# Patient Record
Sex: Male | Born: 1965 | Race: White | Hispanic: No | Marital: Single | Smoking: Never smoker
Health system: Southern US, Community
[De-identification: ages and names within clinical notes are randomized; demographics above are authoritative.]

## PROBLEM LIST (undated history)

## (undated) DIAGNOSIS — F82 Specific developmental disorder of motor function: Secondary | ICD-10-CM

## (undated) DIAGNOSIS — F840 Autistic disorder: Secondary | ICD-10-CM

## (undated) HISTORY — PX: CYST REMOVAL NECK: SHX6281

## (undated) HISTORY — DX: Autistic disorder: F84.0

## (undated) HISTORY — DX: Specific developmental disorder of motor function: F82

---

## 2020-05-30 ENCOUNTER — Other Ambulatory Visit: Payer: Self-pay

## 2020-05-30 ENCOUNTER — Inpatient Hospital Stay
Admission: EM | Admit: 2020-05-30 | Discharge: 2020-06-19 | DRG: 177 | Disposition: A | Discharge status: Home Health | Payer: Managed Care, Other (non HMO) | Attending: Internal Medicine | Admitting: Internal Medicine

## 2020-05-30 ENCOUNTER — Emergency Department: Payer: Managed Care, Other (non HMO)

## 2020-05-30 ENCOUNTER — Encounter: Payer: Self-pay | Admitting: Emergency Medicine

## 2020-05-30 DIAGNOSIS — E872 Acidosis, unspecified: Secondary | ICD-10-CM

## 2020-05-30 DIAGNOSIS — R197 Diarrhea, unspecified: Secondary | ICD-10-CM

## 2020-05-30 DIAGNOSIS — L03116 Cellulitis of left lower limb: Secondary | ICD-10-CM | POA: Diagnosis present

## 2020-05-30 DIAGNOSIS — R0602 Shortness of breath: Secondary | ICD-10-CM

## 2020-05-30 DIAGNOSIS — U071 COVID-19: Principal | ICD-10-CM | POA: Diagnosis present

## 2020-05-30 DIAGNOSIS — J1282 Pneumonia due to coronavirus disease 2019: Secondary | ICD-10-CM | POA: Diagnosis present

## 2020-05-30 DIAGNOSIS — R6250 Unspecified lack of expected normal physiological development in childhood: Secondary | ICD-10-CM | POA: Diagnosis present

## 2020-05-30 DIAGNOSIS — Z6841 Body Mass Index (BMI) 40.0 and over, adult: Secondary | ICD-10-CM

## 2020-05-30 DIAGNOSIS — E861 Hypovolemia: Secondary | ICD-10-CM | POA: Diagnosis present

## 2020-05-30 DIAGNOSIS — E871 Hypo-osmolality and hyponatremia: Secondary | ICD-10-CM | POA: Diagnosis present

## 2020-05-30 DIAGNOSIS — J9601 Acute respiratory failure with hypoxia: Secondary | ICD-10-CM | POA: Diagnosis present

## 2020-05-30 DIAGNOSIS — N179 Acute kidney failure, unspecified: Secondary | ICD-10-CM

## 2020-05-30 LAB — CBC WITH DIFFERENTIAL/PLATELET
Abs Immature Granulocytes: 0.02 10*3/uL (ref 0.00–0.07)
Basophils Absolute: 0 10*3/uL (ref 0.0–0.1)
Basophils Relative: 0 %
Eosinophils Absolute: 0 10*3/uL (ref 0.0–0.5)
Eosinophils Relative: 0 %
HCT: 42.1 % (ref 39.0–52.0)
Hemoglobin: 15.4 g/dL (ref 13.0–17.0)
Immature Granulocytes: 0 %
Lymphocytes Relative: 12 %
Lymphs Abs: 0.7 10*3/uL (ref 0.7–4.0)
MCH: 30.5 pg (ref 26.0–34.0)
MCHC: 36.6 g/dL — ABNORMAL HIGH (ref 30.0–36.0)
MCV: 83.4 fL (ref 80.0–100.0)
Monocytes Absolute: 0.3 10*3/uL (ref 0.1–1.0)
Monocytes Relative: 4 %
Neutro Abs: 5.2 10*3/uL (ref 1.7–7.7)
Neutrophils Relative %: 84 %
Platelets: 146 10*3/uL — ABNORMAL LOW (ref 150–400)
RBC: 5.05 MIL/uL (ref 4.22–5.81)
RDW: 13.5 % (ref 11.5–15.5)
WBC: 6.2 10*3/uL (ref 4.0–10.5)
nRBC: 0 % (ref 0.0–0.2)

## 2020-05-30 LAB — TROPONIN I (HIGH SENSITIVITY): Troponin I (High Sensitivity): 15 ng/L (ref <18)

## 2020-05-30 LAB — COMPREHENSIVE METABOLIC PANEL
ALT: 78 U/L — ABNORMAL HIGH (ref 0–44)
AST: 98 U/L — ABNORMAL HIGH (ref 15–41)
Albumin: 3.9 g/dL (ref 3.5–5.0)
Alkaline Phosphatase: 80 U/L (ref 38–126)
Anion gap: 15 (ref 5–15)
BUN: 19 mg/dL (ref 6–20)
CO2: 17 mmol/L — ABNORMAL LOW (ref 22–32)
Calcium: 8.2 mg/dL — ABNORMAL LOW (ref 8.9–10.3)
Chloride: 97 mmol/L — ABNORMAL LOW (ref 98–111)
Creatinine, Ser: 1.26 mg/dL — ABNORMAL HIGH (ref 0.61–1.24)
GFR calc Af Amer: >60 mL/min (ref >60)
GFR calc non Af Amer: >60 mL/min (ref >60)
Glucose, Bld: 129 mg/dL — ABNORMAL HIGH (ref 70–99)
Potassium: 3.2 mmol/L — ABNORMAL LOW (ref 3.5–5.1)
Sodium: 129 mmol/L — ABNORMAL LOW (ref 135–145)
Total Bilirubin: 0.9 mg/dL (ref 0.3–1.2)
Total Protein: 7.6 g/dL (ref 6.5–8.1)

## 2020-05-30 LAB — SARS CORONAVIRUS 2 BY RT PCR (HOSPITAL ORDER, PERFORMED IN ~~LOC~~ HOSPITAL LAB): SARS Coronavirus 2: POSITIVE — AB

## 2020-05-30 MED ORDER — LACTATED RINGERS IV BOLUS
1000.0000 mL | Freq: Once | INTRAVENOUS | Status: AC
  Administered 2020-05-30: 1000 mL via INTRAVENOUS

## 2020-05-30 MED ORDER — ACETAMINOPHEN 325 MG PO TABS
650.0000 mg | ORAL_TABLET | Freq: Once | ORAL | Status: AC | PRN
  Administered 2020-05-30: 650 mg via ORAL
  Filled 2020-05-30: qty 2

## 2020-05-30 NOTE — ED Triage Notes (Signed)
Pt presents to ED with fever, sob, weakness, frequent diarrhea, and non-productive cough for the past week. Pt reports his shortness of breath worsened today. Pt has increased work of breathing noted.

## 2020-05-30 NOTE — ED Provider Notes (Signed)
[facility] Emergency Department Provider Note   ____________________________________________   First MD Initiated Contact with Patient 05/30/20 2310     (approximate)  I have reviewed the triage vital signs and the nursing notes.   HISTORY  Chief Complaint Fever and Shortness of Breath    HPI Dwayne Dalton is a 54 y.o. male with no significant past medical history who presents to the ED complaining of fever and cough.  Patient reports that he has been feeling bad for about the past week with subjective fevers, chills, cough, chest pain, and shortness of breath.  He also endorses diarrhea and feels like he has been getting dehydrated.  He describes the pain in his chest as sharp and exacerbated when he goes to take a deep breath.  He thought his symptoms might be due to working outside in the heat and he denies any recent sick contacts.  He has not been vaccinated against COVID-19.  He denies any dysuria or hematuria.        History reviewed. No pertinent past medical history.  There are no problems to display for this patient.   History reviewed. No pertinent surgical history.  Prior to Admission medications   Not on File    Allergies Patient has no known allergies.  No family history on file.  Social History Social History   Tobacco Use   Smoking status: Never Smoker   Smokeless tobacco: Never Used  Building services engineer Use: Never used  Substance Use Topics   Alcohol use: Never   Drug use: Never    Review of Systems  Constitutional: Positive for fever/chills Eyes: No visual changes. ENT: No sore throat. Cardiovascular: Positive for chest pain. Respiratory: Positive for cough and shortness of breath. Gastrointestinal: No abdominal pain.  No nausea, no vomiting.  Positive for diarrhea.  No constipation. Genitourinary: Negative for dysuria. Musculoskeletal: Negative for back pain. Skin: Negative for rash. Neurological:  Negative for headaches, focal weakness or numbness.  ____________________________________________   PHYSICAL EXAM:  VITAL SIGNS: ED Triage Vitals  Enc Vitals Group     BP 05/30/20 2048 (!) 150/90     Pulse Rate 05/30/20 2048 (!) 110     Resp 05/30/20 2048 (!) 28     Temp 05/30/20 2048 (!) 102.7 F (39.3 C)     Temp Source 05/30/20 2048 Oral     SpO2 05/30/20 2048 93 %     Weight 05/30/20 2052 275 lb (124.7 kg)     Height 05/30/20 2052 5\' 7"  (1.702 m)     Head Circumference --      Peak Flow --      Pain Score 05/30/20 2052 0     Pain Loc --      Pain Edu? --      Excl. in GC? --     Constitutional: Alert and oriented. Eyes: Conjunctivae are normal. Head: Atraumatic. Nose: No congestion/rhinnorhea. Mouth/Throat: Mucous membranes are moist. Neck: Normal ROM Cardiovascular: Tachycardic, regular rhythm. Grossly normal heart sounds. Respiratory: Tachypneic with increased respiratory effort.  No retractions. Lungs CTAB. Gastrointestinal: Soft and nontender. No distention. Genitourinary: deferred Musculoskeletal: No lower extremity tenderness nor edema. Neurologic:  Normal speech and language. No gross focal neurologic deficits are appreciated. Skin:  Skin is warm, dry and intact. No rash noted. Psychiatric: Mood and affect are normal. Speech and behavior are normal.  ____________________________________________   LABS (all labs ordered are listed, but only abnormal results are displayed)  Labs Reviewed  SARS CORONAVIRUS 2 BY RT PCR (HOSPITAL ORDER, PERFORMED IN [facility]) - Abnormal; Notable for the following components:      Result Value   SARS Coronavirus 2 POSITIVE (*)    All other components within normal limits  CBC WITH DIFFERENTIAL/PLATELET - Abnormal; Notable for the following components:   MCHC 36.6 (*)    Platelets 146 (*)    All other components within normal limits  COMPREHENSIVE METABOLIC PANEL - Abnormal; Notable for the following  components:   Sodium 129 (*)    Potassium 3.2 (*)    Chloride 97 (*)    CO2 17 (*)    Glucose, Bld 129 (*)    Creatinine, Ser 1.26 (*)    Calcium 8.2 (*)    AST 98 (*)    ALT 78 (*)    All other components within normal limits  TROPONIN I (HIGH SENSITIVITY) - Abnormal; Notable for the following components:   Troponin I (High Sensitivity) 18 (*)    All other components within normal limits  CULTURE, BLOOD (ROUTINE X 2)  CULTURE, BLOOD (ROUTINE X 2)  LACTIC ACID, PLASMA  PROCALCITONIN  LACTIC ACID, PLASMA  C-REACTIVE PROTEIN  FIBRIN DERIVATIVES D-DIMER ([facility] ONLY)  FERRITIN  TROPONIN I (HIGH SENSITIVITY)   ____________________________________________  EKG  ED ECG REPORT I, Chesley Noon, the attending physician, personally viewed and interpreted this ECG.   Date: 05/30/2020  EKG Time: 21:12  Rate: 105  Rhythm: sinus tachycardia  Axis: Normal  Intervals:none  ST&T Change: None   PROCEDURES  Procedure(s) performed (including Critical Care):  Procedures   ____________________________________________   INITIAL IMPRESSION / ASSESSMENT AND PLAN / ED COURSE       54 year old male with no significant past medical history presents to the ED with 1 week of subjective fevers, chills, cough, chest pain, and shortness of breath.  He denies any sick contacts but has not yet been vaccinated for COVID-19.  COVID-19 testing was performed from triage and is positive, chest x-ray also appears consistent with a viral pneumonia.  He was febrile and tachycardic as well as tachypneic on presentation, but is well-appearing on my assessment and I have low suspicion for bacterial sepsis.  We will screen blood cultures and lactate as well as procalcitonin, no leukocytosis noted.  He is tachypneic but maintaining O2 sats on room air, we will have him ambulate on a pulse ox.  Labs also remarkable for mild hyponatremia as well as possible AKI, but patient has no prior creatinine for  comparison.  Given his diarrhea, he is likely dehydrated and we will hydrate with IV fluids.  While patient is maintaining his O2 sats, he continues to have significant tachypnea with respiratory rate of 28 on reassessment.  Given this as well as mild transaminitis and hyponatremia, case discussed with hospitalist for admission.      ____________________________________________   FINAL CLINICAL IMPRESSION(S) / ED DIAGNOSES  Final diagnoses:  Pneumonia due to COVID-19 virus  Shortness of breath     ED Discharge Orders    None       Note:  This document was prepared using Dragon voice recognition software and may include unintentional dictation errors.   Chesley Noon, MD 05/31/20 [phone]

## 2020-05-30 NOTE — ED Notes (Signed)
Pt reports feeling dehydrated, diarrhea and cough for 1 week.  Pt works in Warden/ranger at [employer].   Pt has sob.  No chest pain.  Sinus tach on monitor.  Iv started.

## 2020-05-31 ENCOUNTER — Other Ambulatory Visit: Payer: Self-pay

## 2020-05-31 ENCOUNTER — Encounter: Payer: Self-pay | Admitting: Internal Medicine

## 2020-05-31 DIAGNOSIS — E872 Acidosis, unspecified: Secondary | ICD-10-CM

## 2020-05-31 DIAGNOSIS — R0602 Shortness of breath: Secondary | ICD-10-CM | POA: Diagnosis present

## 2020-05-31 DIAGNOSIS — E861 Hypovolemia: Secondary | ICD-10-CM | POA: Diagnosis present

## 2020-05-31 DIAGNOSIS — U071 COVID-19: Secondary | ICD-10-CM | POA: Diagnosis present

## 2020-05-31 DIAGNOSIS — A0839 Other viral enteritis: Secondary | ICD-10-CM

## 2020-05-31 DIAGNOSIS — Z6841 Body Mass Index (BMI) 40.0 and over, adult: Secondary | ICD-10-CM | POA: Diagnosis not present

## 2020-05-31 DIAGNOSIS — R6250 Unspecified lack of expected normal physiological development in childhood: Secondary | ICD-10-CM | POA: Diagnosis present

## 2020-05-31 DIAGNOSIS — E871 Hypo-osmolality and hyponatremia: Secondary | ICD-10-CM | POA: Diagnosis present

## 2020-05-31 DIAGNOSIS — E66813 Obesity, class 3: Secondary | ICD-10-CM

## 2020-05-31 DIAGNOSIS — L03116 Cellulitis of left lower limb: Secondary | ICD-10-CM | POA: Diagnosis present

## 2020-05-31 DIAGNOSIS — J9601 Acute respiratory failure with hypoxia: Secondary | ICD-10-CM | POA: Diagnosis present

## 2020-05-31 DIAGNOSIS — J1282 Pneumonia due to coronavirus disease 2019: Secondary | ICD-10-CM

## 2020-05-31 DIAGNOSIS — N179 Acute kidney failure, unspecified: Secondary | ICD-10-CM | POA: Diagnosis present

## 2020-05-31 DIAGNOSIS — J9600 Acute respiratory failure, unspecified whether with hypoxia or hypercapnia: Secondary | ICD-10-CM | POA: Diagnosis not present

## 2020-05-31 HISTORY — DX: Obesity, class 3: E66.813

## 2020-05-31 HISTORY — DX: Morbid (severe) obesity due to excess calories: E66.01

## 2020-05-31 LAB — CBC
HCT: 38.4 % — ABNORMAL LOW (ref 39.0–52.0)
Hemoglobin: 13.9 g/dL (ref 13.0–17.0)
MCH: 30.6 pg (ref 26.0–34.0)
MCHC: 36.2 g/dL — ABNORMAL HIGH (ref 30.0–36.0)
MCV: 84.6 fL (ref 80.0–100.0)
Platelets: 129 10*3/uL — ABNORMAL LOW (ref 150–400)
RBC: 4.54 MIL/uL (ref 4.22–5.81)
RDW: 13.4 % (ref 11.5–15.5)
WBC: 5.7 10*3/uL (ref 4.0–10.5)
nRBC: 0 % (ref 0.0–0.2)

## 2020-05-31 LAB — TROPONIN I (HIGH SENSITIVITY): Troponin I (High Sensitivity): 18 ng/L — ABNORMAL HIGH (ref <18)

## 2020-05-31 LAB — PROCALCITONIN: Procalcitonin: 0.27 ng/mL

## 2020-05-31 LAB — LACTIC ACID, PLASMA
Lactic Acid, Venous: 1.5 mmol/L (ref 0.5–1.9)
Lactic Acid, Venous: 1.8 mmol/L (ref 0.5–1.9)

## 2020-05-31 LAB — ABO/RH: ABO/RH(D): O NEG

## 2020-05-31 LAB — FERRITIN: Ferritin: 1370 ng/mL — ABNORMAL HIGH (ref 24–336)

## 2020-05-31 LAB — FIBRIN DERIVATIVES D-DIMER (ARMC ONLY): Fibrin derivatives D-dimer (ARMC): 1312.84 ng/mL (FEU) — ABNORMAL HIGH (ref 0.00–499.00)

## 2020-05-31 LAB — CREATININE, SERUM
Creatinine, Ser: 1.68 mg/dL — ABNORMAL HIGH (ref 0.61–1.24)
GFR calc Af Amer: 53 mL/min — ABNORMAL LOW (ref >60)
GFR calc non Af Amer: 46 mL/min — ABNORMAL LOW (ref >60)

## 2020-05-31 LAB — HIV ANTIBODY (ROUTINE TESTING W REFLEX): HIV Screen 4th Generation wRfx: NONREACTIVE

## 2020-05-31 LAB — C-REACTIVE PROTEIN: CRP: 12.2 mg/dL — ABNORMAL HIGH (ref <1.0)

## 2020-05-31 MED ORDER — ACETAMINOPHEN 325 MG PO TABS
650.0000 mg | ORAL_TABLET | Freq: Four times a day (QID) | ORAL | Status: DC | PRN
  Administered 2020-05-31 – 2020-06-07 (×4): 650 mg via ORAL
  Filled 2020-05-31 (×4): qty 2

## 2020-05-31 MED ORDER — LACTATED RINGERS IV BOLUS
1000.0000 mL | Freq: Once | INTRAVENOUS | Status: AC
  Administered 2020-05-31: 1000 mL via INTRAVENOUS

## 2020-05-31 MED ORDER — ZINC SULFATE 220 (50 ZN) MG PO CAPS
220.0000 mg | ORAL_CAPSULE | Freq: Every day | ORAL | Status: DC
  Administered 2020-05-31 – 2020-06-19 (×20): 220 mg via ORAL
  Filled 2020-05-31 (×20): qty 1

## 2020-05-31 MED ORDER — ENOXAPARIN SODIUM 60 MG/0.6ML ~~LOC~~ SOLN
0.5000 mg/kg | SUBCUTANEOUS | Status: DC
  Administered 2020-06-01 – 2020-06-02 (×2): 62.5 mg via SUBCUTANEOUS
  Filled 2020-05-31 (×3): qty 1.2

## 2020-05-31 MED ORDER — LACTATED RINGERS IV SOLN: INTRAVENOUS | Status: DC

## 2020-05-31 MED ORDER — ENOXAPARIN SODIUM 40 MG/0.4ML ~~LOC~~ SOLN
40.0000 mg | Freq: Two times a day (BID) | SUBCUTANEOUS | Status: DC
  Administered 2020-05-31: 40 mg via SUBCUTANEOUS
  Filled 2020-05-31: qty 0.4

## 2020-05-31 MED ORDER — DEXAMETHASONE SODIUM PHOSPHATE 10 MG/ML IJ SOLN
10.0000 mg | Freq: Once | INTRAMUSCULAR | Status: AC
  Administered 2020-05-31: 10 mg via INTRAVENOUS
  Filled 2020-05-31: qty 1

## 2020-05-31 MED ORDER — SODIUM CHLORIDE 0.9 % IV SOLN
200.0000 mg | Freq: Once | INTRAVENOUS | Status: AC
  Administered 2020-05-31: 200 mg via INTRAVENOUS
  Filled 2020-05-31: qty 200

## 2020-05-31 MED ORDER — DEXAMETHASONE SODIUM PHOSPHATE 10 MG/ML IJ SOLN
6.0000 mg | INTRAMUSCULAR | Status: DC
  Administered 2020-06-01 – 2020-06-03 (×4): 6 mg via INTRAVENOUS
  Filled 2020-05-31 (×4): qty 1

## 2020-05-31 MED ORDER — SODIUM CHLORIDE 0.9 % IV SOLN
100.0000 mg | Freq: Every day | INTRAVENOUS | Status: AC
  Administered 2020-06-01 – 2020-06-04 (×4): 100 mg via INTRAVENOUS
  Filled 2020-05-31 (×4): qty 20

## 2020-05-31 MED ORDER — ONDANSETRON HCL 4 MG/2ML IJ SOLN
4.0000 mg | Freq: Four times a day (QID) | INTRAMUSCULAR | Status: DC | PRN
  Administered 2020-06-04: 4 mg via INTRAVENOUS
  Filled 2020-05-31: qty 2

## 2020-05-31 MED ORDER — ALBUTEROL SULFATE HFA 108 (90 BASE) MCG/ACT IN AERS
2.0000 | INHALATION_SPRAY | Freq: Four times a day (QID) | RESPIRATORY_TRACT | Status: DC
  Administered 2020-05-31 – 2020-06-07 (×30): 2 via RESPIRATORY_TRACT
  Filled 2020-05-31: qty 6.7

## 2020-05-31 MED ORDER — ASCORBIC ACID 500 MG PO TABS
500.0000 mg | ORAL_TABLET | Freq: Every day | ORAL | Status: DC
  Administered 2020-05-31 – 2020-06-19 (×20): 500 mg via ORAL
  Filled 2020-05-31 (×20): qty 1

## 2020-05-31 MED ORDER — GUAIFENESIN-DM 100-10 MG/5ML PO SYRP
10.0000 mL | ORAL_SOLUTION | ORAL | Status: DC | PRN
  Administered 2020-05-31 – 2020-06-06 (×7): 10 mL via ORAL
  Filled 2020-05-31 (×8): qty 10

## 2020-05-31 MED ORDER — HYDROCOD POLST-CPM POLST ER 10-8 MG/5ML PO SUER
5.0000 mL | Freq: Two times a day (BID) | ORAL | Status: DC | PRN
  Administered 2020-05-31 – 2020-06-15 (×16): 5 mL via ORAL
  Filled 2020-05-31 (×16): qty 5

## 2020-05-31 MED ORDER — ONDANSETRON HCL 4 MG PO TABS: 4.0000 mg | ORAL_TABLET | Freq: Four times a day (QID) | ORAL | Status: DC | PRN

## 2020-05-31 NOTE — Plan of Care (Signed)
  Problem: Education: Goal: Knowledge of risk factors and measures for prevention of condition will improve Outcome: Progressing   

## 2020-05-31 NOTE — H&P (Signed)
History and Physical    Dwayne Dalton VWU:[id number] DOB: 21-Jul-1966 DOA: 05/30/2020  PCP: Patient, No Pcp Per   Patient coming from: home  I have personally briefly reviewed patient's old medical records in [facility]  Chief Complaint: Shortness of breath  HPI: Dwayne Dalton is a 54 y.o. male with no significant medical history who presents with a 1 week history of fever chills cough and shortness of breath as well as chest pain now with associated diarrhea.  Chest pain is worse on taking a deep breath.  Patient is unvaccinated against Covid.  ED Course: On arrival febrile at 102.7, tachycardia at 110, tachypneic at 28 with O2 sats 93% on room air.  Covid positive.  Chest x-ray consistent with viral pneumonia.  Blood work showing mild hyponatremia of 129 with mild AKI, creatinine 1.26 with bicarb of 17. Lactic acid 1.5.  Patient was hydrated in the ER.  Hospitalist consulted for admission.  Review of Systems: As per HPI otherwise all other systems on review of systems negative.    History reviewed. No pertinent past medical history.  History reviewed. No pertinent surgical history.   reports that he has never smoked. He has never used smokeless tobacco. He reports that he does not drink alcohol and does not use drugs.  No Known Allergies  History reviewed. No pertinent family history.    Prior to Admission medications   Not on File    Physical Exam: Vitals:   05/31/20 0030 05/31/20 0045 05/31/20 0100 05/31/20 0115  BP: (!) 145/85  (!) 142/79   Pulse: 93 95 90 88  Resp:    (!) 27  Temp:      TempSrc:      SpO2: 97% 97% 97% 97%  Weight:      Height:         Vitals:   05/31/20 0030 05/31/20 0045 05/31/20 0100 05/31/20 0115  BP: (!) 145/85  (!) 142/79   Pulse: 93 95 90 88  Resp:    (!) 27  Temp:      TempSrc:      SpO2: 97% 97% 97% 97%  Weight:      Height:          Constitutional:  Tachypneic and appears breathless but awake and alert  HEENT: Limited  due to Covid positivity. Cardiovascular:  Tachycardic. No murmurs, gallops, or rubs. 2+ symmetrical distal pulses are present . No JVD. No LE edema Respiratory:  Tachypneic with increased work of breathing..  Gastrointestinal: Soft, non tender, and non distended with positive bowel sounds. No rebound or guarding. Genitourinary: No CVA tenderness. Musculoskeletal: Nontender with normal range of motion in all extremities. No cyanosis, or erythema of extremities. Neurologic: Normal speech and language. Face is symmetric. Moving all extremities. No gross focal neurologic deficits . Skin: Skin is warm, dry.  No rash or ulcers Psychiatric: Mood and affect are normal Speech and behavior are normal   Labs on Admission: I have personally reviewed following labs and imaging studies  CBC: Recent Labs  Lab 05/30/20 2059  WBC 6.2  NEUTROABS 5.2  HGB 15.4  HCT 42.1  MCV 83.4  PLT 146*   Basic Metabolic Panel: Recent Labs  Lab 05/30/20 2059  NA 129*  K 3.2*  CL 97*  CO2 17*  GLUCOSE 129*  BUN 19  CREATININE 1.26*  CALCIUM 8.2*   GFR: Estimated Creatinine Clearance: 86.8 mL/min (A) (by C-G formula based on SCr of 1.26 mg/dL (H)). Liver Function Tests:  Recent Labs  Lab 05/30/20 2059  AST 98*  ALT 78*  ALKPHOS 80  BILITOT 0.9  PROT 7.6  ALBUMIN 3.9   No results for input(s): LIPASE, AMYLASE in the last 168 hours. No results for input(s): AMMONIA in the last 168 hours. Coagulation Profile: No results for input(s): INR, PROTIME in the last 168 hours. Cardiac Enzymes: No results for input(s): CKTOTAL, CKMB, CKMBINDEX, TROPONINI in the last 168 hours. BNP (last 3 results) No results for input(s): PROBNP in the last 8760 hours. HbA1C: No results for input(s): HGBA1C in the last 72 hours. CBG: No results for input(s): GLUCAP in the last 168 hours. Lipid Profile: No results for input(s): CHOL, HDL, LDLCALC, TRIG, CHOLHDL, LDLDIRECT in the last 72 hours. Thyroid Function  Tests: No results for input(s): TSH, T4TOTAL, FREET4, T3FREE, THYROIDAB in the last 72 hours. Anemia Panel: No results for input(s): VITAMINB12, FOLATE, FERRITIN, TIBC, IRON, RETICCTPCT in the last 72 hours. Urine analysis: No results found for: COLORURINE, APPEARANCEUR, LABSPEC, PHURINE, GLUCOSEU, HGBUR, BILIRUBINUR, KETONESUR, PROTEINUR, UROBILINOGEN, NITRITE, LEUKOCYTESUR  Radiological Exams on Admission: DG Chest 2 View  Result Date: 05/30/2020 CLINICAL DATA:  54 year old male with shortness of breath. EXAM: CHEST - 2 VIEW COMPARISON:  None. FINDINGS: Bilateral confluent densities primarily involving the mid to lower lung fields and peripheral and subpleural lung most consistent with multifocal pneumonia. Clinical correlation and follow-up recommended. There is no pleural effusion pneumothorax. The cardiac silhouette is within limits. No acute osseous pathology. IMPRESSION: Multifocal pneumonia. Clinical correlation and follow-up recommended. Electronically Signed   By: Elgie Collard M.D.   On: 05/30/2020 21:23    EKG: Independently reviewed. Interpretation : Sinus tachycardia Assessment/Plan 54 year old male with history of morbid obesity but otherwise no significant past medical history unvaccinated against Covid who presents to the emergency room with fever cough and shortness of breath, as well as diarrhea and weakness testing positive for Covid    Pneumonia due to COVID-19 virus, unvaccinated   Diarrhea due to COVID-19 -Patient with cough, shortness of breath, fever, tachycardia, mild hypoxia of 93 and tachypnea, mild diarrhea acute kidney injury normal lactic acid, Covid positive with typical changes on chest x-ray -Remdesivir, Decadron, albuterol, antitussives, vitamins -Oxygen to keep sats over 94% -Follow inflammatory biomarkers    AKI (acute kidney injury) (HCC)   Metabolic acidosis Hyponatremia, likely hypovolemic -Creatinine 1.26 in the setting of diarrhea, baseline  unknown with bicarb 17.  Sodium 129, -IV fluid bolus of LR given in the emergency room -We will hold further IV hydration for now for potential worsening of Covid pneumonia, with IV hydration as needed going forward    Obesity, Class III, BMI 40-49.9 (morbid obesity) (HCC) -Complicating factor for severe Covid    DVT prophylaxis: Lovenox  Code Status: full code  Family Communication:  none  Disposition Plan: Back to previous home environment Consults called: none  Status:At the time of admission, it appears that the appropriate admission status for this patient is INPATIENT. This is judged to be reasonable and necessary in order to provide the required intensity of service to ensure the patient's safety given the presenting symptoms, physical exam findings, and initial radiographic and laboratory data in the context of their  Comorbid conditions.   Patient requires inpatient status due to high intensity of service, high risk for further deterioration and high frequency of surveillance required.   I certify that at the point of admission it is my clinical judgment that the patient will require inpatient hospital care spanning beyond  2 midnights     Andris Baumann MD [facility]     05/31/2020, 2:19 AM

## 2020-05-31 NOTE — Progress Notes (Signed)
Remdesivir - Pharmacy Brief Note   O:  CXR: IMPRESSION: Multifocal pneumonia. Clinical correlation and follow-up recommended. SpO2: 93 - 97% on RA   A/P:  Remdesivir 200 mg IVPB once followed by 100 mg IVPB daily x 4 days.   Thomasene Ripple, PharmD, BCPS Clinical Pharmacist 05/31/2020 1:49 AM

## 2020-05-31 NOTE — ED Notes (Signed)
Report off to paige rn  

## 2020-05-31 NOTE — Progress Notes (Signed)
PHARMACIST - PHYSICIAN COMMUNICATION  CONCERNING:  Enoxaparin (Lovenox) for DVT Prophylaxis    RECOMMENDATION: Patient was prescribed enoxaprin 40mg  q24 hours for VTE prophylaxis.   Filed Weights   05/30/20 2052  Weight: 124.7 kg (275 lb)    Body mass index is 43.07 kg/m.  Estimated Creatinine Clearance: 65.1 mL/min (A) (by C-G formula based on SCr of 1.68 mg/dL (H)).   Based on [facility] policy patient is candidate for enoxaparin 0.5mg /kg every 24 hours due to BMI being >30.   DESCRIPTION: Pharmacy has adjusted enoxaparin dose per [facility] policy.  Patient is now receiving enoxaparin 62.5 mg every 24 hours   [facility], PharmD, BCPS Clinical Pharmacist 05/31/2020 10:26 AM

## 2020-05-31 NOTE — Progress Notes (Signed)
[facility] PLAN OF CARE NOTE Patient: Dwayne Dalton TFT:[id number]   PCP: Patient, No Pcp Per DOB: 08-Mar-1966   DOA: 05/30/2020   DOS: 05/31/2020    Patient was admitted by my colleague earlier on 05/31/2020. I have reviewed the H&P as well as assessment and plan and agree with the same. Important changes in the plan are listed below.  Plan of care: Active Problems:   Pneumonia due to COVID-19 virus   Diarrhea due to COVID-19   AKI (acute kidney injury) (HCC)   Metabolic acidosis   Obesity, Class III, BMI 40-49.9 (morbid obesity) (HCC)   COVID-19   Author: Lynden Oxford, MD [facility] 05/31/2020 6:00 PM   If 7PM-7AM, please contact night-coverage at [email or web address]

## 2020-06-01 LAB — C-REACTIVE PROTEIN: CRP: 8.5 mg/dL — ABNORMAL HIGH (ref <1.0)

## 2020-06-01 LAB — FIBRIN DERIVATIVES D-DIMER (ARMC ONLY): Fibrin derivatives D-dimer (ARMC): 1095.21 ng/mL (FEU) — ABNORMAL HIGH (ref 0.00–499.00)

## 2020-06-01 LAB — COMPREHENSIVE METABOLIC PANEL
ALT: 80 U/L — ABNORMAL HIGH (ref 0–44)
AST: 96 U/L — ABNORMAL HIGH (ref 15–41)
Albumin: 3.5 g/dL (ref 3.5–5.0)
Alkaline Phosphatase: 69 U/L (ref 38–126)
Anion gap: 11 (ref 5–15)
BUN: 17 mg/dL (ref 6–20)
CO2: 22 mmol/L (ref 22–32)
Calcium: 8.3 mg/dL — ABNORMAL LOW (ref 8.9–10.3)
Chloride: 102 mmol/L (ref 98–111)
Creatinine, Ser: 1.17 mg/dL (ref 0.61–1.24)
GFR calc Af Amer: >60 mL/min (ref >60)
GFR calc non Af Amer: >60 mL/min (ref >60)
Glucose, Bld: 147 mg/dL — ABNORMAL HIGH (ref 70–99)
Potassium: 3.3 mmol/L — ABNORMAL LOW (ref 3.5–5.1)
Sodium: 135 mmol/L (ref 135–145)
Total Bilirubin: 1.2 mg/dL (ref 0.3–1.2)
Total Protein: 6.6 g/dL (ref 6.5–8.1)

## 2020-06-01 LAB — CBC WITH DIFFERENTIAL/PLATELET
Abs Immature Granulocytes: 0.03 10*3/uL (ref 0.00–0.07)
Basophils Absolute: 0 10*3/uL (ref 0.0–0.1)
Basophils Relative: 0 %
Eosinophils Absolute: 0 10*3/uL (ref 0.0–0.5)
Eosinophils Relative: 0 %
HCT: 40.7 % (ref 39.0–52.0)
Hemoglobin: 14.6 g/dL (ref 13.0–17.0)
Immature Granulocytes: 1 %
Lymphocytes Relative: 12 %
Lymphs Abs: 0.8 10*3/uL (ref 0.7–4.0)
MCH: 30.5 pg (ref 26.0–34.0)
MCHC: 35.9 g/dL (ref 30.0–36.0)
MCV: 85.1 fL (ref 80.0–100.0)
Monocytes Absolute: 0.2 10*3/uL (ref 0.1–1.0)
Monocytes Relative: 3 %
Neutro Abs: 5.3 10*3/uL (ref 1.7–7.7)
Neutrophils Relative %: 84 %
Platelets: 148 10*3/uL — ABNORMAL LOW (ref 150–400)
RBC: 4.78 MIL/uL (ref 4.22–5.81)
RDW: 13.8 % (ref 11.5–15.5)
WBC: 6.2 10*3/uL (ref 4.0–10.5)
nRBC: 0 % (ref 0.0–0.2)

## 2020-06-01 LAB — FERRITIN: Ferritin: 1812 ng/mL — ABNORMAL HIGH (ref 24–336)

## 2020-06-01 LAB — MAGNESIUM: Magnesium: 2.3 mg/dL (ref 1.7–2.4)

## 2020-06-01 NOTE — Progress Notes (Signed)
[facility] Progress Note  Patient: Dwayne Dalton    ION:[id number]  DOA: 05/30/2020     Date of Service: the patient was seen and examined on 06/01/2020  Brief hospital course: Past medical history of developmental delay. Presents with shortness of breath and found to have COVID-19 pneumonia. Currently plan is continue current care.  Assessment and Plan: 1. Acute COVID-19 Viral Pneumonia CXR: hazy bilateral peripheral opacities Tmax last 24 hours:  Temp (24hrs), Avg:98.4 F (36.9 C), Min:98.2 F (36.8 C), Max:98.5 F (36.9 C)  Oxygen requirements: On 2 LPM CRP: 8.5 from 12.2 Remdesivir: Started on 05/30/2020 Steroids: Started on 05/30/2020 Baricitinib/Actemra(off-label use): Currently not indicated The investigational nature of this medication was discussed with the patient/HCPOA and they choose to proceed as the potential benefits are felt to outweigh risks at this time.  Antibiotics: Not indicated Vitamin C and Zinc: Continue DVT Prophylaxis: Place and maintain sequential compression device Start: 05/31/20 1358 weight-based Lovenox Prone positioning: Patient encouraged to stay in prone position as much as possible.  The treatment plan and use of medications and known side effects were discussed with patient/family. It was clearly explained that Complete risks and long-term side effects are unknown, however in the best clinical judgment they seem to be of some clinical benefit rather than medical risks. Patient/family agree with the treatment plan and want to receive these treatments as indicated.   2. Bilateral lower extremity edema Chronic per patient. Continue SCD  3. Obesity, morbid Placing the patient at high risk for poor outcomes with COVID-19 Body mass index is 43.07 kg/m.   Diet: Regular diet DVT Prophylaxis: Lovenox weight-based 0.5 mg/kg Place and maintain sequential compression device Start: 05/31/20 1358    Advance goals of care discussion: Full  code  Family Communication: no family was present at bedside, at the time of interview.  Discussed with family on phone. Opportunity was given to ask question and all questions were answered satisfactorily.   Disposition:  Status is: Inpatient  Remains inpatient appropriate because:Inpatient level of care appropriate due to severity of illness   Dispo: The patient is from: Home              Anticipated d/c is to: Home              Anticipated d/c date is: 3 days              Patient currently is not medically stable to d/c.  Subjective: No nausea no vomiting. No fever no chills. No chest pain. Feeling better. Still has cough and hypoxia.  Physical Exam:  General: Appear in mild distress, no Rash; Oral Mucosa Clear, moist. no Abnormal Neck Mass Or lumps, Conjunctiva normal  Cardiovascular: S1 and S2 Present, no Murmur, Respiratory: increased respiratory effort, Bilateral Air entry present and bilateral  Crackles, no wheezes Abdomen: Bowel Sound present, Soft and no tenderness Extremities: no Pedal edema, no calf tenderness Neurology: alert and oriented to time, place, and person affect appropriate. no new focal deficit Gait not checked due to patient safety concerns  Vitals:   05/31/20 2044 06/01/20 0051 06/01/20 0459 06/01/20 0830  BP: (!) 160/96 (!) 154/79 (!) 145/91 (!) 153/97  Pulse: 94 92 89 93  Resp: 20 18 18 17   Temp: 98.5 F (36.9 C) 98.5 F (36.9 C) 98.2 F (36.8 C) 98.4 F (36.9 C)  TempSrc: Oral Oral Oral Oral  SpO2: 94% 95% 94% 92%  Weight:      Height:  Intake/Output Summary (Last 24 hours) at 06/01/2020 1843 Last data filed at 06/01/2020 1503 Gross per 24 hour  Intake 100 ml  Output 125 ml  Net -25 ml   Filed Weights   05/30/20 2052  Weight: 124.7 kg    Data Reviewed: I have personally reviewed and interpreted daily labs, tele strips, imagings as discussed above. I reviewed all nursing notes, pharmacy notes, vitals, pertinent old records I  have discussed plan of care as described above with RN and patient/family.  CBC: Recent Labs  Lab 05/30/20 2059 05/31/20 0132 06/01/20 0655  WBC 6.2 5.7 6.2  NEUTROABS 5.2  --  5.3  HGB 15.4 13.9 14.6  HCT 42.1 38.4* 40.7  MCV 83.4 84.6 85.1  PLT 146* 129* 148*   Basic Metabolic Panel: Recent Labs  Lab 05/30/20 2059 05/31/20 0132 06/01/20 0655  NA 129*  --  135  K 3.2*  --  3.3*  CL 97*  --  102  CO2 17*  --  22  GLUCOSE 129*  --  147*  BUN 19  --  17  CREATININE 1.26* 1.68* 1.17  CALCIUM 8.2*  --  8.3*  MG  --   --  2.3    Studies: No results found.  Scheduled Meds: . albuterol  2 puff Inhalation Q6H  . vitamin C  500 mg Oral Daily  . dexamethasone (DECADRON) injection  6 mg Intravenous Q24H  . enoxaparin (LOVENOX) injection  0.5 mg/kg Subcutaneous Q24H  . zinc sulfate  220 mg Oral Daily   Continuous Infusions: . remdesivir 100 mg in NS 100 mL Stopped (06/01/20 0913)   PRN Meds: acetaminophen, chlorpheniramine-HYDROcodone, guaiFENesin-dextromethorphan, ondansetron **OR** ondansetron (ZOFRAN) IV  Time spent: 35 minutes  Author: Lynden Oxford, MD [facility] 06/01/2020 6:43 PM  To reach On-call, see care teams to locate the attending and reach out via [email or web address]. Between 7PM-7AM, please contact night-coverage If you still have difficulty reaching the attending provider, please page the [facility] (Director on Call) for [facility] on amion for assistance.

## 2020-06-01 NOTE — Progress Notes (Signed)
RNCM spoke to patient by telephone. At this time he is denying any needs related to his medications and does not have any home medications listed in chart. RNCM will follow and if any needs arise will offer assistance as able.

## 2020-06-02 ENCOUNTER — Inpatient Hospital Stay: Payer: Managed Care, Other (non HMO)

## 2020-06-02 LAB — CBC WITH DIFFERENTIAL/PLATELET
Abs Immature Granulocytes: 0.03 10*3/uL (ref 0.00–0.07)
Basophils Absolute: 0 10*3/uL (ref 0.0–0.1)
Basophils Relative: 0 %
Eosinophils Absolute: 0 10*3/uL (ref 0.0–0.5)
Eosinophils Relative: 0 %
HCT: 42.5 % (ref 39.0–52.0)
Hemoglobin: 15.4 g/dL (ref 13.0–17.0)
Immature Granulocytes: 1 %
Lymphocytes Relative: 11 %
Lymphs Abs: 0.7 10*3/uL (ref 0.7–4.0)
MCH: 30.7 pg (ref 26.0–34.0)
MCHC: 36.2 g/dL — ABNORMAL HIGH (ref 30.0–36.0)
MCV: 84.8 fL (ref 80.0–100.0)
Monocytes Absolute: 0.4 10*3/uL (ref 0.1–1.0)
Monocytes Relative: 7 %
Neutro Abs: 5.2 10*3/uL (ref 1.7–7.7)
Neutrophils Relative %: 81 %
Platelets: 179 10*3/uL (ref 150–400)
RBC: 5.01 MIL/uL (ref 4.22–5.81)
RDW: 14 % (ref 11.5–15.5)
WBC: 6.3 10*3/uL (ref 4.0–10.5)
nRBC: 0 % (ref 0.0–0.2)

## 2020-06-02 LAB — COMPREHENSIVE METABOLIC PANEL
ALT: 83 U/L — ABNORMAL HIGH (ref 0–44)
AST: 84 U/L — ABNORMAL HIGH (ref 15–41)
Albumin: 3.4 g/dL — ABNORMAL LOW (ref 3.5–5.0)
Alkaline Phosphatase: 70 U/L (ref 38–126)
Anion gap: 14 (ref 5–15)
BUN: 20 mg/dL (ref 6–20)
CO2: 27 mmol/L (ref 22–32)
Calcium: 8.5 mg/dL — ABNORMAL LOW (ref 8.9–10.3)
Chloride: 107 mmol/L (ref 98–111)
Creatinine, Ser: 1.15 mg/dL (ref 0.61–1.24)
GFR calc Af Amer: >60 mL/min (ref >60)
GFR calc non Af Amer: >60 mL/min (ref >60)
Glucose, Bld: 159 mg/dL — ABNORMAL HIGH (ref 70–99)
Potassium: 3.3 mmol/L — ABNORMAL LOW (ref 3.5–5.1)
Sodium: 140 mmol/L (ref 135–145)
Total Bilirubin: 1.1 mg/dL (ref 0.3–1.2)
Total Protein: 6.5 g/dL (ref 6.5–8.1)

## 2020-06-02 LAB — FERRITIN: Ferritin: 1968 ng/mL — ABNORMAL HIGH (ref 24–336)

## 2020-06-02 LAB — C-REACTIVE PROTEIN: CRP: 4.1 mg/dL — ABNORMAL HIGH (ref <1.0)

## 2020-06-02 LAB — MAGNESIUM: Magnesium: 2.7 mg/dL — ABNORMAL HIGH (ref 1.7–2.4)

## 2020-06-02 LAB — FIBRIN DERIVATIVES D-DIMER (ARMC ONLY): Fibrin derivatives D-dimer (ARMC): 962.25 ng/mL (FEU) — ABNORMAL HIGH (ref 0.00–499.00)

## 2020-06-02 MED ORDER — POTASSIUM CHLORIDE CRYS ER 20 MEQ PO TBCR
40.0000 meq | EXTENDED_RELEASE_TABLET | Freq: Once | ORAL | Status: AC
  Administered 2020-06-02: 40 meq via ORAL
  Filled 2020-06-02: qty 2

## 2020-06-02 MED ORDER — ENOXAPARIN SODIUM 60 MG/0.6ML ~~LOC~~ SOLN
60.0000 mg | SUBCUTANEOUS | Status: DC
  Administered 2020-06-03 – 2020-06-13 (×11): 60 mg via SUBCUTANEOUS
  Filled 2020-06-02 (×11): qty 0.6

## 2020-06-02 MED ORDER — CEPHALEXIN 500 MG PO CAPS
500.0000 mg | ORAL_CAPSULE | Freq: Four times a day (QID) | ORAL | Status: DC
  Administered 2020-06-02 – 2020-06-06 (×16): 500 mg via ORAL
  Filled 2020-06-02 (×16): qty 1

## 2020-06-02 MED ORDER — SACCHAROMYCES BOULARDII 250 MG PO CAPS
250.0000 mg | ORAL_CAPSULE | Freq: Two times a day (BID) | ORAL | Status: DC
  Administered 2020-06-02 – 2020-06-19 (×34): 250 mg via ORAL
  Filled 2020-06-02 (×35): qty 1

## 2020-06-02 NOTE — Progress Notes (Addendum)
[facility] Progress Note  Patient: Dwayne Dalton    MBW:[id number]  DOA: 05/30/2020     Date of Service: the patient was seen and examined on 06/02/2020  Brief hospital course: Past medical history of developmental delay. Presents with shortness of breath and found to have COVID-19 pneumonia. Currently plan is continue current care.  Assessment and Plan: 1. Acute COVID-19 Viral Pneumonia CXR: hazy bilateral peripheral opacities Tmax last 24 hours:  Temp (24hrs), Avg:98.5 F (36.9 C), Min:97.5 F (36.4 C), Max:99.1 F (37.3 C)  Oxygen requirements: On 2 LPM CRP: 4.1 from 12.2 Remdesivir: Started on 05/30/2020 Steroids: Started on 05/30/2020 Baricitinib/Actemra(off-label use): Currently not indicated The investigational nature of this medication was discussed with the patient/HCPOA and they choose to proceed as the potential benefits are felt to outweigh risks at this time.  Antibiotics: Not indicated Vitamin C and Zinc: Continue DVT Prophylaxis: Place and maintain sequential compression device Start: 05/31/20 1358 weight-based Lovenox Prone positioning: Patient encouraged to stay in prone position as much as possible.  The treatment plan and use of medications and known side effects were discussed with patient/family. It was clearly explained that Complete risks and long-term side effects are unknown, however in the best clinical judgment they seem to be of some clinical benefit rather than medical risks. Patient/family agree with the treatment plan and want to receive these treatments as indicated.   2. Bilateral lower extremity edema Chronic per patient. Continue SCD  3. Obesity, morbid Placing the patient at high risk for poor outcomes with COVID-19 Body mass index is 43.07 kg/m.   4.  Diarrhea. Florastor.  5.  Left lower extremity cellulitis. Added Keflex.  Diet: Regular diet DVT Prophylaxis: Lovenox weight-based 0.5 mg/kg Place and maintain sequential  compression device Start: 05/31/20 1358    Advance goals of care discussion: Full code  Family Communication: no family was present at bedside, at the time of interview.  Discussed with family on phone. Opportunity was given to ask question and all questions were answered satisfactorily.   Disposition:  Status is: Inpatient  Remains inpatient appropriate because:Inpatient level of care appropriate due to severity of illness   Dispo: The patient is from: Home              Anticipated d/c is to: Home              Anticipated d/c date is: 3 days              Patient currently is not medically stable to d/c.  Subjective: No nausea no vomiting.  No fever no chills.  Reports diarrhea with watery stool without any blood.  Distended abdomen.  Physical Exam:  General: Appear in mild distress, no Rash; Oral Mucosa Clear, moist. no Abnormal Neck Mass Or lumps, Conjunctiva normal  Cardiovascular: S1 and S2 Present, no Murmur, Respiratory: increased respiratory effort, Bilateral Air entry present and bilateral  Crackles, no wheezes Abdomen: Bowel Sound present, Soft and no tenderness Extremities: no Pedal edema, no calf tenderness Neurology: alert and oriented to time, place, and person affect appropriate. no new focal deficit Gait not checked due to patient safety concerns  Vitals:   06/01/20 2053 06/02/20 0453 06/02/20 0827 06/02/20 1631  BP:  (!) 141/96 (!) 154/89 (!) 168/98  Pulse:  85 81 81  Resp:  20 20 20   Temp: (!) 97.5 F (36.4 C) 99 F (37.2 C) 99.1 F (37.3 C) 98.5 F (36.9 C)  TempSrc: Oral  Axillary   SpO2:  94% 94% 94%  Weight:      Height:        Intake/Output Summary (Last 24 hours) at 06/02/2020 2042 Last data filed at 06/02/2020 1731 Gross per 24 hour  Intake 0.27 ml  Output 550 ml  Net -549.73 ml   Filed Weights   05/30/20 2052  Weight: 124.7 kg    Data Reviewed: I have personally reviewed and interpreted daily labs, tele strips, imagings as discussed  above. I reviewed all nursing notes, pharmacy notes, vitals, pertinent old records I have discussed plan of care as described above with RN and patient/family.  CBC: Recent Labs  Lab 05/30/20 2059 05/31/20 0132 06/01/20 0655 06/02/20 0705  WBC 6.2 5.7 6.2 6.3  NEUTROABS 5.2  --  5.3 5.2  HGB 15.4 13.9 14.6 15.4  HCT 42.1 38.4* 40.7 42.5  MCV 83.4 84.6 85.1 84.8  PLT 146* 129* 148* 179   Basic Metabolic Panel: Recent Labs  Lab 05/30/20 2059 05/31/20 0132 06/01/20 0655 06/02/20 0705  NA 129*  --  135 140  K 3.2*  --  3.3* 3.3*  CL 97*  --  102 107  CO2 17*  --  22 27  GLUCOSE 129*  --  147* 159*  BUN 19  --  17 20  CREATININE 1.26* 1.68* 1.17 1.15  CALCIUM 8.2*  --  8.3* 8.5*  MG  --   --  2.3 2.7*    Studies: No results found.  Scheduled Meds: . albuterol  2 puff Inhalation Q6H  . vitamin C  500 mg Oral Daily  . dexamethasone (DECADRON) injection  6 mg Intravenous Q24H  . enoxaparin (LOVENOX) injection  0.5 mg/kg Subcutaneous Q24H  . zinc sulfate  220 mg Oral Daily   Continuous Infusions: . remdesivir 100 mg in NS 100 mL Stopped (06/02/20 0946)   PRN Meds: acetaminophen, chlorpheniramine-HYDROcodone, guaiFENesin-dextromethorphan, ondansetron **OR** ondansetron (ZOFRAN) IV  Time spent: 35 minutes  Author: Lynden Oxford, MD [facility] 06/02/2020 8:42 PM  To reach On-call, see care teams to locate the attending and reach out via [email or web address]. Between 7PM-7AM, please contact night-coverage If you still have difficulty reaching the attending provider, please page the [facility] (Director on Call) for [facility] on amion for assistance.

## 2020-06-03 LAB — COMPREHENSIVE METABOLIC PANEL
ALT: 84 U/L — ABNORMAL HIGH (ref 0–44)
AST: 67 U/L — ABNORMAL HIGH (ref 15–41)
Albumin: 3 g/dL — ABNORMAL LOW (ref 3.5–5.0)
Alkaline Phosphatase: 74 U/L (ref 38–126)
Anion gap: 8 (ref 5–15)
BUN: 22 mg/dL — ABNORMAL HIGH (ref 6–20)
CO2: 26 mmol/L (ref 22–32)
Calcium: 7.9 mg/dL — ABNORMAL LOW (ref 8.9–10.3)
Chloride: 106 mmol/L (ref 98–111)
Creatinine, Ser: 1 mg/dL (ref 0.61–1.24)
GFR calc Af Amer: >60 mL/min (ref >60)
GFR calc non Af Amer: >60 mL/min (ref >60)
Glucose, Bld: 169 mg/dL — ABNORMAL HIGH (ref 70–99)
Potassium: 3.3 mmol/L — ABNORMAL LOW (ref 3.5–5.1)
Sodium: 140 mmol/L (ref 135–145)
Total Bilirubin: 0.8 mg/dL (ref 0.3–1.2)
Total Protein: 6.3 g/dL — ABNORMAL LOW (ref 6.5–8.1)

## 2020-06-03 LAB — CBC WITH DIFFERENTIAL/PLATELET
Abs Immature Granulocytes: 0.06 10*3/uL (ref 0.00–0.07)
Basophils Absolute: 0 10*3/uL (ref 0.0–0.1)
Basophils Relative: 0 %
Eosinophils Absolute: 0 10*3/uL (ref 0.0–0.5)
Eosinophils Relative: 0 %
HCT: 41.7 % (ref 39.0–52.0)
Hemoglobin: 14.8 g/dL (ref 13.0–17.0)
Immature Granulocytes: 1 %
Lymphocytes Relative: 8 %
Lymphs Abs: 0.6 10*3/uL — ABNORMAL LOW (ref 0.7–4.0)
MCH: 30.2 pg (ref 26.0–34.0)
MCHC: 35.5 g/dL (ref 30.0–36.0)
MCV: 85.1 fL (ref 80.0–100.0)
Monocytes Absolute: 0.5 10*3/uL (ref 0.1–1.0)
Monocytes Relative: 7 %
Neutro Abs: 5.8 10*3/uL (ref 1.7–7.7)
Neutrophils Relative %: 84 %
Platelets: 214 10*3/uL (ref 150–400)
RBC: 4.9 MIL/uL (ref 4.22–5.81)
RDW: 14 % (ref 11.5–15.5)
WBC: 6.9 10*3/uL (ref 4.0–10.5)
nRBC: 0 % (ref 0.0–0.2)

## 2020-06-03 LAB — FERRITIN: Ferritin: 1345 ng/mL — ABNORMAL HIGH (ref 24–336)

## 2020-06-03 LAB — FIBRIN DERIVATIVES D-DIMER (ARMC ONLY): Fibrin derivatives D-dimer (ARMC): 688.72 ng/mL (FEU) — ABNORMAL HIGH (ref 0.00–499.00)

## 2020-06-03 LAB — MAGNESIUM: Magnesium: 2.7 mg/dL — ABNORMAL HIGH (ref 1.7–2.4)

## 2020-06-03 LAB — C-REACTIVE PROTEIN: CRP: 1.9 mg/dL — ABNORMAL HIGH (ref <1.0)

## 2020-06-03 MED ORDER — LOPERAMIDE HCL 2 MG PO CAPS
2.0000 mg | ORAL_CAPSULE | ORAL | Status: DC | PRN
  Administered 2020-06-04 – 2020-06-06 (×6): 2 mg via ORAL
  Filled 2020-06-03 (×6): qty 1

## 2020-06-03 MED ORDER — POTASSIUM CHLORIDE CRYS ER 20 MEQ PO TBCR
40.0000 meq | EXTENDED_RELEASE_TABLET | Freq: Once | ORAL | Status: AC
  Administered 2020-06-03: 40 meq via ORAL
  Filled 2020-06-03: qty 2

## 2020-06-03 NOTE — Progress Notes (Signed)
[facility] Progress Note  Patient: Dwayne Dalton    QVZ:[id number]  DOA: 05/30/2020     Date of Service: the patient was seen and examined on 06/03/2020  Brief hospital course: Past medical history of developmental delay. Presents with shortness of breath and found to have COVID-19 pneumonia. Currently plan is continue current care.  Assessment and Plan: 1. Acute COVID-19 Viral Pneumonia CXR: hazy bilateral peripheral opacities Oxygen requirements: On 2 LPM CRP: 4.1 from 12.2 Remdesivir: Started on 05/30/2020 Steroids: Started on 05/30/2020 Baricitinib/Actemra(off-label use): Currently not indicated The investigational nature of this medication was discussed with the patient/HCPOA and they choose to proceed as the potential benefits are felt to outweigh risks at this time.  Antibiotics: Not indicated Vitamin C and Zinc: Continue DVT Prophylaxis: Place and maintain sequential compression device Start: 05/31/20 1358 weight-based Lovenox Prone positioning: Patient encouraged to stay in prone position as much as possible.  The treatment plan and use of medications and known side effects were discussed with patient/family. It was clearly explained that Complete risks and long-term side effects are unknown, however in the best clinical judgment they seem to be of some clinical benefit rather than medical risks. Patient/family agree with the treatment plan and want to receive these treatments as indicated.   2. Bilateral lower extremity edema Chronic per patient. Continue SCD  3. Obesity, morbid Placing the patient at high risk for poor outcomes with COVID-19 Body mass index is 43.07 kg/m.   4.  Diarrhea. Florastor.  Add Imodium  5.  Left lower extremity cellulitis. Added Keflex.  Diet: Regular diet DVT Prophylaxis: Lovenox weight-based 0.5 mg/kg Place and maintain sequential compression device Start: 05/31/20 1358  Advance goals of care discussion: Full code  Family  Communication: no family was present at bedside, at the time of interview.  Discussed with family on phone. Opportunity was given to ask question and all questions were answered satisfactorily.   Disposition:  Status is: Inpatient  Remains inpatient appropriate because:Inpatient level of care appropriate due to severity of illness   Dispo: The patient is from: Home              Anticipated d/c is to: Home              Anticipated d/c date is: 3 days              Patient currently is not medically stable to d/c.  Subjective: Reports right upper quadrant abdominal pain.  It is mild in nature.  No nausea no vomiting.  No fever no chills.  Diarrhea improving.  Reports more shortness of breath than yesterday.  Cough about the same.  Physical Exam:  General: Appear in mild distress, no Rash; Oral Mucosa Clear, moist. no Abnormal Neck Mass Or lumps, Conjunctiva normal  Cardiovascular: S1 and S2 Present, no Murmur, Respiratory: increased respiratory effort, Bilateral Air entry present and bilateral  Crackles, no wheezes Abdomen: Bowel Sound present, Soft and no tenderness Extremities: no Pedal edema, no calf tenderness Neurology: alert and oriented to time, place, and person affect appropriate. no new focal deficit Gait not checked due to patient safety concerns  Vitals:   06/03/20 0443 06/03/20 0844 06/03/20 1401 06/03/20 1644  BP: (!) 142/95 (!) 147/90 (!) 155/96 (!) 146/99  Pulse: 84 85 90 90  Resp: 18 18 (!) 40 (!) 42  Temp: 98.1 F (36.7 C) (!) 97.5 F (36.4 C) 98.3 F (36.8 C) 98.7 F (37.1 C)  TempSrc: Oral Oral Oral Oral  SpO2: 91% (!) 87% 90% 91%  Weight:      Height:       No intake or output data in the 24 hours ending 06/03/20 1812 Filed Weights   05/30/20 2052  Weight: 124.7 kg    Data Reviewed: I have personally reviewed and interpreted daily labs, tele strips, imagings as discussed above. I reviewed all nursing notes, pharmacy notes, vitals, pertinent old  records I have discussed plan of care as described above with RN and patient/family.  CBC: Recent Labs  Lab 05/30/20 2059 05/31/20 0132 06/01/20 0655 06/02/20 0705 06/03/20 0411  WBC 6.2 5.7 6.2 6.3 6.9  NEUTROABS 5.2  --  5.3 5.2 5.8  HGB 15.4 13.9 14.6 15.4 14.8  HCT 42.1 38.4* 40.7 42.5 41.7  MCV 83.4 84.6 85.1 84.8 85.1  PLT 146* 129* 148* 179 214   Basic Metabolic Panel: Recent Labs  Lab 05/30/20 2059 05/31/20 0132 06/01/20 0655 06/02/20 0705 06/03/20 0411  NA 129*  --  135 140 140  K 3.2*  --  3.3* 3.3* 3.3*  CL 97*  --  102 107 106  CO2 17*  --  22 27 26   GLUCOSE 129*  --  147* 159* 169*  BUN 19  --  17 20 22*  CREATININE 1.26* 1.68* 1.17 1.15 1.00  CALCIUM 8.2*  --  8.3* 8.5* 7.9*  MG  --   --  2.3 2.7* 2.7*    Studies: DG Abd Portable 1V  Result Date: 06/02/2020 CLINICAL DATA:  Diarrhea, history of COVID-19 positivity EXAM: PORTABLE ABDOMEN - 1 VIEW COMPARISON:  None. FINDINGS: Scattered large and small bowel gas is noted. No obstructive changes are seen. No free air is noted. Degenerative changes of lumbar spine are noted. No abnormal calcifications are seen. IMPRESSION: No acute abnormality noted. Electronically Signed   By: 06/04/2020 M.D.   On: 06/02/2020 21:44    Scheduled Meds: . albuterol  2 puff Inhalation Q6H  . vitamin C  500 mg Oral Daily  . cephALEXin  500 mg Oral Q6H  . dexamethasone (DECADRON) injection  6 mg Intravenous Q24H  . enoxaparin (LOVENOX) injection  60 mg Subcutaneous Q24H  . saccharomyces boulardii  250 mg Oral BID  . zinc sulfate  220 mg Oral Daily   Continuous Infusions: . remdesivir 100 mg in NS 100 mL 100 mg (06/03/20 1030)   PRN Meds: acetaminophen, chlorpheniramine-HYDROcodone, guaiFENesin-dextromethorphan, loperamide, ondansetron **OR** ondansetron (ZOFRAN) IV  Time spent: 35 minutes  Author: 06/05/20, MD [facility] 06/03/2020 6:12 PM  To reach On-call, see care teams to locate the attending and reach  out via [email or web address]. Between 7PM-7AM, please contact night-coverage If you still have difficulty reaching the attending provider, please page the [facility] (Director on Call) for [facility] on amion for assistance.

## 2020-06-04 LAB — CBC WITH DIFFERENTIAL/PLATELET
Abs Immature Granulocytes: 0.14 10*3/uL — ABNORMAL HIGH (ref 0.00–0.07)
Basophils Absolute: 0 10*3/uL (ref 0.0–0.1)
Basophils Relative: 0 %
Eosinophils Absolute: 0 10*3/uL (ref 0.0–0.5)
Eosinophils Relative: 0 %
HCT: 41.5 % (ref 39.0–52.0)
Hemoglobin: 14.5 g/dL (ref 13.0–17.0)
Immature Granulocytes: 2 %
Lymphocytes Relative: 7 %
Lymphs Abs: 0.7 10*3/uL (ref 0.7–4.0)
MCH: 29.8 pg (ref 26.0–34.0)
MCHC: 34.9 g/dL (ref 30.0–36.0)
MCV: 85.2 fL (ref 80.0–100.0)
Monocytes Absolute: 0.5 10*3/uL (ref 0.1–1.0)
Monocytes Relative: 6 %
Neutro Abs: 8.1 10*3/uL — ABNORMAL HIGH (ref 1.7–7.7)
Neutrophils Relative %: 85 %
Platelets: 273 10*3/uL (ref 150–400)
RBC: 4.87 MIL/uL (ref 4.22–5.81)
RDW: 13.9 % (ref 11.5–15.5)
WBC: 9.5 10*3/uL (ref 4.0–10.5)
nRBC: 0.3 % — ABNORMAL HIGH (ref 0.0–0.2)

## 2020-06-04 LAB — COMPREHENSIVE METABOLIC PANEL
ALT: 85 U/L — ABNORMAL HIGH (ref 0–44)
AST: 57 U/L — ABNORMAL HIGH (ref 15–41)
Albumin: 3 g/dL — ABNORMAL LOW (ref 3.5–5.0)
Alkaline Phosphatase: 71 U/L (ref 38–126)
Anion gap: 6 (ref 5–15)
BUN: 21 mg/dL — ABNORMAL HIGH (ref 6–20)
CO2: 28 mmol/L (ref 22–32)
Calcium: 8 mg/dL — ABNORMAL LOW (ref 8.9–10.3)
Chloride: 103 mmol/L (ref 98–111)
Creatinine, Ser: 0.93 mg/dL (ref 0.61–1.24)
GFR calc Af Amer: >60 mL/min (ref >60)
GFR calc non Af Amer: >60 mL/min (ref >60)
Glucose, Bld: 157 mg/dL — ABNORMAL HIGH (ref 70–99)
Potassium: 2.9 mmol/L — ABNORMAL LOW (ref 3.5–5.1)
Sodium: 137 mmol/L (ref 135–145)
Total Bilirubin: 1 mg/dL (ref 0.3–1.2)
Total Protein: 6.1 g/dL — ABNORMAL LOW (ref 6.5–8.1)

## 2020-06-04 LAB — C-REACTIVE PROTEIN: CRP: 1.5 mg/dL — ABNORMAL HIGH (ref <1.0)

## 2020-06-04 LAB — MAGNESIUM: Magnesium: 2.5 mg/dL — ABNORMAL HIGH (ref 1.7–2.4)

## 2020-06-04 LAB — FERRITIN: Ferritin: 1306 ng/mL — ABNORMAL HIGH (ref 24–336)

## 2020-06-04 LAB — FIBRIN DERIVATIVES D-DIMER (ARMC ONLY): Fibrin derivatives D-dimer (ARMC): 635.5 ng/mL (FEU) — ABNORMAL HIGH (ref 0.00–499.00)

## 2020-06-04 MED ORDER — DEXAMETHASONE SODIUM PHOSPHATE 10 MG/ML IJ SOLN
6.0000 mg | INTRAMUSCULAR | Status: DC
  Administered 2020-06-04: 21:00:00 6 mg via INTRAVENOUS
  Filled 2020-06-04: qty 1

## 2020-06-04 MED ORDER — POTASSIUM CHLORIDE CRYS ER 20 MEQ PO TBCR
40.0000 meq | EXTENDED_RELEASE_TABLET | ORAL | Status: AC
  Administered 2020-06-04 (×2): 40 meq via ORAL
  Filled 2020-06-04 (×2): qty 2

## 2020-06-04 NOTE — Progress Notes (Addendum)
Physical Therapy Evaluation Patient Details Name: Dwayne Dalton MRN: [id number] DOB: 17-Nov-1965 Today's Date: 06/04/2020   History of Present Illness  per MD note:Dwayne Dalton is a 54 y.o. male with no significant medical history who presents with a 1 week history of fever chills cough and shortness of breath as well as chest pain now with associated diarrhea.  Chest pain is worse on taking a deep breath.  Patient is unvaccinated against Covid.  ED Course: On arrival febrile at 102.7, tachycardia at 110, tachypneic at 28 with O2 sats 93% on room air.  Covid positive.  Chest x-ray consistent with viral pneumonia.  Blood work showing mild hyponatremia of 129 with mild AKI, creatinine 1.26 with bicarb of 17. Lactic acid 1.5.  Patient was hydrated in the ER.   Clinical Impression  Patient agrees to PT evaluation.he lives alone and has level entry into home.  He reports no pain. He was independent with ambulation prior to hospitalization without AD. He has -3/5 strength BLE. He needs mod assist for supine to sit bed mobility and min assist for sit to supine bed mobility. He is able to sit at edge of bed but has O2 desaturation to 85% in sitting with 2 L O2. He has good static and dynamic sitting balance. He will continue to benefit from skilled PT to improve mobility and maintain O2 saturation > 90%.     Follow Up Recommendations SNF    Equipment Recommendations       Recommendations for Other Services       Precautions / Restrictions Precautions Precautions: None Restrictions Weight Bearing Restrictions: No      Mobility  Bed Mobility Overal bed mobility: Needs Assistance Bed Mobility: Supine to Sit;Sit to Supine     Supine to sit: Mod assist Sit to supine: Min assist   General bed mobility comments: needs more time  Transfers Overall transfer level:  (O2 saturation decreased to 85% in sitting; standing deferred)                  Ambulation/Gait                 Stairs            Wheelchair Mobility    Modified Rankin (Stroke Patients Only)       Balance                                             Pertinent Vitals/Pain Pain Assessment: No/denies pain    Home Living Family/patient expects to be discharged to:: Private residence Living Arrangements: Alone     Home Access: Level entry     Home Layout: One level        Prior Function Level of Independence: Independent               Hand Dominance        Extremity/Trunk Assessment   Upper Extremity Assessment Upper Extremity Assessment: Overall WFL for tasks assessed    Lower Extremity Assessment Lower Extremity Assessment: Generalized weakness       Communication   Communication: No difficulties  Cognition Arousal/Alertness: Awake/alert Behavior During Therapy: WFL for tasks assessed/performed Overall Cognitive Status: Within Functional Limits for tasks assessed  General Comments      Exercises     Assessment/Plan    PT Assessment Patient needs continued PT services  PT Problem List Decreased strength;Decreased mobility       PT Treatment Interventions Stair training;Functional mobility training;Therapeutic activities;Therapeutic exercise    PT Goals (Current goals can be found in the Care Plan section)  Acute Rehab PT Goals Patient Stated Goal: no goals stated PT Goal Formulation: Patient unable to participate in goal setting Time For Goal Achievement: 06/18/20 Potential to Achieve Goals: Fair    Frequency Min 2X/week   Barriers to discharge        Co-evaluation               AM-PAC PT "6 Clicks" Mobility  Outcome Measure Help needed turning from your back to your side while in a flat bed without using bedrails?: A Little Help needed moving from lying on your back to sitting on the side of a flat bed without using bedrails?: A Lot Help needed  moving to and from a bed to a chair (including a wheelchair)?: A Lot Help needed standing up from a chair using your arms (e.g., wheelchair or bedside chair)?: A Lot Help needed to walk in hospital room?: A Lot Help needed climbing 3-5 steps with a railing? : A Lot 6 Click Score: 13    End of Session Equipment Utilized During Treatment: Gait belt;Oxygen Activity Tolerance: Treatment limited secondary to medical complications (Comment) (O2 saturation 85%) Patient left: in bed Nurse Communication: Mobility status PT Visit Diagnosis: Muscle weakness (generalized) (M62.81);Difficulty in walking, not elsewhere classified (R26.2)    Time: 1425-1440 PT Time Calculation (min) (ACUTE ONLY): 15 min   Charges:   PT Evaluation $PT Eval Low Complexity: 1 Low PT Treatments $Therapeutic Activity: 8-22 mins          Ezekiel Ina , PT DPT 06/04/2020, 3:28 PM

## 2020-06-04 NOTE — Progress Notes (Addendum)
[facility] Progress Note  Patient: Dwayne Dalton    ZHG:[id number]  DOA: 05/30/2020     Date of Service: the patient was seen and examined on 06/04/2020  Brief hospital course: Past medical history of developmental delay. Presents with shortness of breath and found to have COVID-19 pneumonia. Currently plan is continue current care.  Assessment and Plan: 1. Acute COVID-19 Viral Pneumonia CXR: hazy bilateral peripheral opacities Oxygen requirements: On 2 LPM CRP: 1.5 from 12.2 Remdesivir: Started on 05/30/2020 Steroids: Started on 05/30/2020 Baricitinib/Actemra(off-label use): Discussed with the patient currently unable to use this medication. The investigational nature of this medication was discussed with the patient/HCPOA and they choose to proceed as the potential benefits are felt to outweigh risks at this time.  Antibiotics: Not indicated Vitamin C and Zinc: Continue DVT Prophylaxis: Place and maintain sequential compression device Start: 05/31/20 1358 weight-based Lovenox Prone positioning: Patient encouraged to stay in prone position as much as possible.  The treatment plan and use of medications and known side effects were discussed with patient/family. It was clearly explained that Complete risks and long-term side effects are unknown, however in the best clinical judgment they seem to be of some clinical benefit rather than medical risks. Patient/family agree with the treatment plan and want to receive these treatments as indicated.   2. Bilateral lower extremity edema Chronic per patient. Continue SCD  3. Obesity, morbid Placing the patient at high risk for poor outcomes with COVID-19 Body mass index is 43.07 kg/m.   4.  Diarrhea. Florastor.  Add Imodium  5.  Left lower extremity cellulitis. Added Keflex.  Diet: Regular diet DVT Prophylaxis: Lovenox weight-based 0.5 mg/kg Place and maintain sequential compression device Start: 05/31/20 1358  Advance goals  of care discussion: Full code  Family Communication: no family was present at bedside, at the time of interview.  Discussed with family on phone. Opportunity was given to ask question and all questions were answered satisfactorily.   Disposition:  Status is: Inpatient  Remains inpatient appropriate because:Inpatient level of care appropriate due to severity of illness  Dispo: The patient is from: Home              Anticipated d/c is to: Home              Anticipated d/c date is: 3 days              Patient currently is not medically stable to d/c.  Subjective: Diarrhea still present but improving.  No nausea no vomiting.  No abdominal pain.  Actually more short of breath than yesterday.  Physical Exam:  General: Appear in mild distress, no Rash; Oral Mucosa Clear, dry. no Abnormal Neck Mass Or lumps, Conjunctiva normal  Cardiovascular: S1 and S2 Present, no Murmur, Respiratory: increased respiratory effort, Bilateral Air entry present and bilateral  Crackles, no wheezes Abdomen: Bowel Sound present, Soft and no tenderness Extremities: bilateral  Pedal edema, no calf tenderness Neurology: alert and oriented to time, place, and person affect appropriate. no new focal deficit Gait not checked due to patient safety concerns  Vitals:   06/04/20 0042 06/04/20 0437 06/04/20 0552 06/04/20 1554  BP: (!) 148/92 (!) 147/92  (!) 147/98  Pulse: 92 89 85 85  Resp: 18 18  18   Temp: 98.4 F (36.9 C) 98.2 F (36.8 C)  97.9 F (36.6 C)  TempSrc: Oral Oral    SpO2: (!) 88% (!) 88% 93% (!) 89%  Weight:      Height:  Intake/Output Summary (Last 24 hours) at 06/04/2020 1734 Last data filed at 06/04/2020 8756 Gross per 24 hour  Intake --  Output 650 ml  Net -650 ml   Filed Weights   05/30/20 2052  Weight: 124.7 kg    Data Reviewed: I have personally reviewed and interpreted daily labs, tele strips, imagings as discussed above. I reviewed all nursing notes, pharmacy notes,  vitals, pertinent old records I have discussed plan of care as described above with RN and patient/family.  CBC: Recent Labs  Lab 05/30/20 2059 05/30/20 2059 05/31/20 0132 06/01/20 0655 06/02/20 0705 06/03/20 0411 06/04/20 0359  WBC 6.2   < > 5.7 6.2 6.3 6.9 9.5  NEUTROABS 5.2  --   --  5.3 5.2 5.8 8.1*  HGB 15.4   < > 13.9 14.6 15.4 14.8 14.5  HCT 42.1   < > 38.4* 40.7 42.5 41.7 41.5  MCV 83.4   < > 84.6 85.1 84.8 85.1 85.2  PLT 146*   < > 129* 148* 179 214 273   < > = values in this interval not displayed.   Basic Metabolic Panel: Recent Labs  Lab 05/30/20 2059 05/30/20 2059 05/31/20 0132 06/01/20 4332 06/02/20 0705 06/03/20 0411 06/04/20 0359  NA 129*  --   --  135 140 140 137  K 3.2*  --   --  3.3* 3.3* 3.3* 2.9*  CL 97*  --   --  102 107 106 103  CO2 17*  --   --  22 27 26 28   GLUCOSE 129*  --   --  147* 159* 169* 157*  BUN 19  --   --  17 20 22* 21*  CREATININE 1.26*   < > 1.68* 1.17 1.15 1.00 0.93  CALCIUM 8.2*  --   --  8.3* 8.5* 7.9* 8.0*  MG  --   --   --  2.3 2.7* 2.7* 2.5*   < > = values in this interval not displayed.    Studies: No results found.  Scheduled Meds: . albuterol  2 puff Inhalation Q6H  . vitamin C  500 mg Oral Daily  . cephALEXin  500 mg Oral Q6H  . dexamethasone (DECADRON) injection  6 mg Intravenous Q24H  . enoxaparin (LOVENOX) injection  60 mg Subcutaneous Q24H  . saccharomyces boulardii  250 mg Oral BID  . zinc sulfate  220 mg Oral Daily   Continuous Infusions:  PRN Meds: acetaminophen, chlorpheniramine-HYDROcodone, guaiFENesin-dextromethorphan, loperamide, ondansetron **OR** ondansetron (ZOFRAN) IV  Time spent: 35 minutes  Author: , MD [facility] 06/04/2020 5:34 PM  To reach On-call, see care teams to locate the attending and reach out via [email or web address]. Between 7PM-7AM, please contact night-coverage If you still have difficulty reaching the attending provider, please page the [facility] (Director on Call)  for [facility] on amion for assistance.

## 2020-06-05 ENCOUNTER — Inpatient Hospital Stay: Payer: Managed Care, Other (non HMO)

## 2020-06-05 ENCOUNTER — Encounter: Payer: Self-pay | Admitting: Internal Medicine

## 2020-06-05 LAB — COMPREHENSIVE METABOLIC PANEL
ALT: 92 U/L — ABNORMAL HIGH (ref 0–44)
AST: 49 U/L — ABNORMAL HIGH (ref 15–41)
Albumin: 3.1 g/dL — ABNORMAL LOW (ref 3.5–5.0)
Alkaline Phosphatase: 79 U/L (ref 38–126)
Anion gap: 14 (ref 5–15)
BUN: 22 mg/dL — ABNORMAL HIGH (ref 6–20)
CO2: 20 mmol/L — ABNORMAL LOW (ref 22–32)
Calcium: 7.9 mg/dL — ABNORMAL LOW (ref 8.9–10.3)
Chloride: 104 mmol/L (ref 98–111)
Creatinine, Ser: 0.96 mg/dL (ref 0.61–1.24)
GFR calc Af Amer: >60 mL/min (ref >60)
GFR calc non Af Amer: >60 mL/min (ref >60)
Glucose, Bld: 149 mg/dL — ABNORMAL HIGH (ref 70–99)
Potassium: 3.5 mmol/L (ref 3.5–5.1)
Sodium: 138 mmol/L (ref 135–145)
Total Bilirubin: 1.1 mg/dL (ref 0.3–1.2)
Total Protein: 6.3 g/dL — ABNORMAL LOW (ref 6.5–8.1)

## 2020-06-05 LAB — CULTURE, BLOOD (ROUTINE X 2)
Culture: NO GROWTH
Culture: NO GROWTH

## 2020-06-05 LAB — CBC WITH DIFFERENTIAL/PLATELET
Abs Immature Granulocytes: 0.36 10*3/uL — ABNORMAL HIGH (ref 0.00–0.07)
Basophils Absolute: 0 10*3/uL (ref 0.0–0.1)
Basophils Relative: 0 %
Eosinophils Absolute: 0 10*3/uL (ref 0.0–0.5)
Eosinophils Relative: 0 %
HCT: 41.4 % (ref 39.0–52.0)
Hemoglobin: 14.8 g/dL (ref 13.0–17.0)
Immature Granulocytes: 3 %
Lymphocytes Relative: 6 %
Lymphs Abs: 0.7 10*3/uL (ref 0.7–4.0)
MCH: 30.3 pg (ref 26.0–34.0)
MCHC: 35.7 g/dL (ref 30.0–36.0)
MCV: 84.8 fL (ref 80.0–100.0)
Monocytes Absolute: 0.5 10*3/uL (ref 0.1–1.0)
Monocytes Relative: 5 %
Neutro Abs: 9.1 10*3/uL — ABNORMAL HIGH (ref 1.7–7.7)
Neutrophils Relative %: 86 %
Platelets: 335 10*3/uL (ref 150–400)
RBC: 4.88 MIL/uL (ref 4.22–5.81)
RDW: 13.9 % (ref 11.5–15.5)
WBC: 10.6 10*3/uL — ABNORMAL HIGH (ref 4.0–10.5)
nRBC: 0.2 % (ref 0.0–0.2)

## 2020-06-05 LAB — C-REACTIVE PROTEIN: CRP: 2.9 mg/dL — ABNORMAL HIGH (ref <1.0)

## 2020-06-05 LAB — MAGNESIUM: Magnesium: 2.4 mg/dL (ref 1.7–2.4)

## 2020-06-05 LAB — FERRITIN: Ferritin: 1435 ng/mL — ABNORMAL HIGH (ref 24–336)

## 2020-06-05 LAB — FIBRIN DERIVATIVES D-DIMER (ARMC ONLY): Fibrin derivatives D-dimer (ARMC): 923.69 ng/mL (FEU) — ABNORMAL HIGH (ref 0.00–499.00)

## 2020-06-05 MED ORDER — METHYLPREDNISOLONE SODIUM SUCC 125 MG IJ SOLR
60.0000 mg | Freq: Three times a day (TID) | INTRAMUSCULAR | Status: DC
  Administered 2020-06-05 – 2020-06-15 (×31): 60 mg via INTRAVENOUS
  Filled 2020-06-05 (×31): qty 2

## 2020-06-05 MED ORDER — IOHEXOL 350 MG/ML SOLN
75.0000 mL | Freq: Once | INTRAVENOUS | Status: AC | PRN
  Administered 2020-06-05: 75 mL via INTRAVENOUS

## 2020-06-05 MED ORDER — IOHEXOL 350 MG/ML SOLN: 100.0000 mL | Freq: Once | INTRAVENOUS | Status: DC | PRN

## 2020-06-05 MED ORDER — FUROSEMIDE 10 MG/ML IJ SOLN
20.0000 mg | Freq: Once | INTRAMUSCULAR | Status: AC
  Administered 2020-06-05: 20 mg via INTRAVENOUS
  Filled 2020-06-05: qty 2

## 2020-06-05 MED ORDER — BARICITINIB 2 MG PO TABS
4.0000 mg | ORAL_TABLET | Freq: Every day | ORAL | Status: AC
  Administered 2020-06-05 – 2020-06-18 (×14): 4 mg via ORAL
  Filled 2020-06-05 (×14): qty 2

## 2020-06-05 NOTE — Progress Notes (Signed)
Pt noted to be alarming desaturations into low 70s. Upon entering room pt noted to have O2 off. Pt stated "I took my oxygen off so I could breathe better. I don't like it." Pt educated on need to keep oxygen in place. Needs reinforcement.

## 2020-06-05 NOTE — Progress Notes (Signed)
Pt taken to CT by transport team in stable condition.

## 2020-06-05 NOTE — Progress Notes (Signed)
Pt SOB, 02 82% on 2L while resting. His oxygen has slowly been increased to 10L of 02 via hiflo n/c to maintain 02 sats >90%.  On call provider made aware.  Pt placed on continuous 02 monitoring.  Pt only utilizes IBD with direction and a lot of encouragement and coaching.  He says that he is unable to prone but able to lay on his side for a short amount of time.   Pt calm at this time; SOB has resolved.

## 2020-06-05 NOTE — Progress Notes (Signed)
Entered pt room to assess and encourage prone or semi prone position. Pt noted to be covered in urine and feces. When asked if he knew he had been incontinent pt stated "umm yea I guess." Pt re-educated on importance of informing staff of need for cleansing in order to prevent skin breakdown. Pt cleansed and assisted to right sided lying semi prone position. RN returned to room less than 5 min later and pt had already returned to supine position. Pt again re-educated on importance of positioning for oxygenation.

## 2020-06-05 NOTE — Progress Notes (Signed)
Returned from CT in stable condition.

## 2020-06-05 NOTE — Progress Notes (Signed)
[facility] Progress Note  Patient: Dwayne Dalton    TAV:[id number]  DOA: 05/30/2020     Date of Service: the patient was seen and examined on 06/05/2020  Brief hospital course: Past medical history of developmental delay. Presents with shortness of breath and found to have COVID-19 pneumonia. Currently plan is continue current care.  Assessment and Plan: 1. Acute COVID-19 Viral Pneumonia CXR: hazy bilateral peripheral opacities Oxygen requirements: On 12 LPM, overnight oxygenation worsened on 06/04/2020 night. CRP: 1.5 from 12.2, went up to 2.9 again. Remdesivir: Started on 05/30/2020 Steroids: Started on 05/30/2020 Baricitinib/Actemra(off-label use): Discussed with the patient currently unable to use this medication.  Started on 06/05/2020. The investigational nature of this medication was discussed with the patient/HCPOA and they choose to proceed as the potential benefits are felt to outweigh risks at this time.  Antibiotics: Not indicated Vitamin C and Zinc: Continue DVT Prophylaxis: Place and maintain sequential compression device Start: 05/31/20 1358 weight-based Lovenox Prone positioning: Patient encouraged to stay in prone position as much as possible.  The treatment plan and use of medications and known side effects were discussed with patient/family. It was clearly explained that Complete risks and long-term side effects are unknown, however in the best clinical judgment they seem to be of some clinical benefit rather than medical risks. Patient/family agree with the treatment plan and want to receive these treatments as indicated.   2. Bilateral lower extremity edema Chronic per patient. Continue SCD  3. Obesity, morbid Placing the patient at high risk for poor outcomes with COVID-19 Body mass index is 43.07 kg/m.   4.  Diarrhea. Florastor.  Add Imodium  5.  Left lower extremity cellulitis. Added Keflex.  6.  Developmental delay. Difficulty understanding of  his current hypoxia.  Diet: Regular diet DVT Prophylaxis: Lovenox weight-based 0.5 mg/kg Place and maintain sequential compression device Start: 05/31/20 1358  Advance goals of care discussion: Full code  Family Communication: no family was present at bedside, at the time of interview.  Discussed with family on phone. Opportunity was given to ask question and all questions were answered satisfactorily.   Disposition:  Status is: Inpatient  Remains inpatient appropriate because:Inpatient level of care appropriate due to severity of illness  Dispo: The patient is from: Home              Anticipated d/c is to: Home              Anticipated d/c date is: 3 days              Patient currently is not medically stable to d/c.  Subjective: Diarrhea improving.  Continues to have shortness of breath but no chest pain.  No nausea or vomiting.  No fever no chills.  Continues to have minimal oral intake.  Difficult to educate the patient regarding incentive spirometer, prone position.  Physical Exam:  General: Appear in mild distress, no Rash; Oral Mucosa Clear, dry. no Abnormal Neck Mass Or lumps, Conjunctiva normal  Cardiovascular: S1 and S2 Present, no Murmur, Respiratory: increased respiratory effort, Bilateral Air entry present and bilateral  Crackles, no wheezes Abdomen: Bowel Sound present, Soft and no tenderness Extremities: bilateral  Pedal edema, no calf tenderness Neurology: alert and oriented to time, place, and person affect appropriate. no new focal deficit Gait not checked due to patient safety concerns  Vitals:   06/05/20 1400 06/05/20 1657 06/05/20 1800 06/05/20 1802  BP:  140/78    Pulse:  74    Resp:  20    Temp:  (!) 97.2 F (36.2 C)    TempSrc:  Axillary    SpO2: 93% 95% (!) 87% 93%  Weight:      Height:        Intake/Output Summary (Last 24 hours) at 06/05/2020 1939 Last data filed at 06/05/2020 1100 Gross per 24 hour  Intake --  Output 900 ml  Net -900 ml    Filed Weights   05/30/20 2052  Weight: 124.7 kg    Data Reviewed: I have personally reviewed and interpreted daily labs, tele strips, imagings as discussed above. I reviewed all nursing notes, pharmacy notes, vitals, pertinent old records I have discussed plan of care as described above with RN and patient/family.  CBC: Recent Labs  Lab 06/01/20 0655 06/02/20 0705 06/03/20 0411 06/04/20 0359 06/05/20 0642  WBC 6.2 6.3 6.9 9.5 10.6*  NEUTROABS 5.3 5.2 5.8 8.1* 9.1*  HGB 14.6 15.4 14.8 14.5 14.8  HCT 40.7 42.5 41.7 41.5 41.4  MCV 85.1 84.8 85.1 85.2 84.8  PLT 148* 179 214 273 335   Basic Metabolic Panel: Recent Labs  Lab 06/01/20 0655 06/02/20 0705 06/03/20 0411 06/04/20 0359 06/05/20 0642  NA 135 140 140 137 138  K 3.3* 3.3* 3.3* 2.9* 3.5  CL 102 107 106 103 104  CO2 22 27 26 28  20*  GLUCOSE 147* 159* 169* 157* 149*  BUN 17 20 22* 21* 22*  CREATININE 1.17 1.15 1.00 0.93 0.96  CALCIUM 8.3* 8.5* 7.9* 8.0* 7.9*  MG 2.3 2.7* 2.7* 2.5* 2.4    Studies: CT ANGIO CHEST PE W OR WO CONTRAST  Result Date: 06/05/2020 CLINICAL DATA:  COVID-19, shortness of breath, suspected pulmonary embolism, diarrhea EXAM: CT ANGIOGRAPHY CHEST CT ABDOMEN AND PELVIS WITH CONTRAST TECHNIQUE: Multidetector CT imaging of the chest was performed using the standard protocol during bolus administration of intravenous contrast. Multiplanar CT image reconstructions and MIPs were obtained to evaluate the vascular anatomy. Multidetector CT imaging of the abdomen and pelvis was performed using the standard protocol during bolus administration of intravenous contrast. CONTRAST:  83mL OMNIPAQUE IOHEXOL 350 MG/ML SOLN IV. No oral contrast. COMPARISON:  None FINDINGS: CTA CHEST FINDINGS Cardiovascular: Mild atherosclerotic calcification aorta. Aorta normal caliber without aneurysm or dissection. Heart unremarkable. No pericardial effusion. Pulmonary arteries adequately opacified. Scattered respiratory motion  artifacts. No definite pulmonary emboli. Mediastinum/Nodes: Esophagus unremarkable. No thoracic adenopathy. Base of cervical region normal appearance. Lungs/Pleura: Extensive BILATERAL airspace infiltrates consistent with multifocal pneumonia and COVID-19. No pleural effusion or pneumothorax. No dominant pulmonary mass. Musculoskeletal: Scattered endplate spur formation thoracic spine. Review of the MIP images confirms the above findings. CT ABDOMEN and PELVIS FINDINGS Hepatobiliary: Mild fatty infiltration of liver. Gallbladder and liver otherwise normal appearance. Pancreas: Normal appearance Spleen: Normal appearance Adrenals/Urinary Tract: Adrenal glands and RIGHT kidney normal appearance. Mild atrophy and minimal scarring LEFT kidney. No renal masses or hydronephrosis. No urinary tract calcification or dilatation. Bladder unremarkable. Stomach/Bowel: Fluid in colon and rectum consistent with history of diarrhea. Slight gaseous distention of transverse colon, nonspecific. No bowel wall thickening or evidence of obstruction. Stomach unremarkable. Vascular/Lymphatic: Aorta normal caliber.  No adenopathy. Reproductive: Unremarkable prostate gland and seminal vesicles Other: Small RIGHT inguinal hernia containing fat. No free air or free fluid. No additional inflammatory process. Musculoskeletal: Degenerative disc disease changes lumbar spine. Review of the MIP images confirms the above findings. IMPRESSION: No evidence of pulmonary embolism. Extensive BILATERAL airspace infiltrates consistent with multifocal pneumonia and COVID-19. Small amount of fluid within  the colon and rectum consistent with history of diarrhea. No acute intra-abdominal or intrapelvic abnormalities. Small RIGHT inguinal hernia containing fat. Aortic Atherosclerosis (ICD10-I70.0). Electronically Signed   By: Ulyses Southward M.D.   On: 06/05/2020 10:32   CT ABDOMEN PELVIS W CONTRAST  Result Date: 06/05/2020 CLINICAL DATA:  COVID-19, shortness of  breath, suspected pulmonary embolism, diarrhea EXAM: CT ANGIOGRAPHY CHEST CT ABDOMEN AND PELVIS WITH CONTRAST TECHNIQUE: Multidetector CT imaging of the chest was performed using the standard protocol during bolus administration of intravenous contrast. Multiplanar CT image reconstructions and MIPs were obtained to evaluate the vascular anatomy. Multidetector CT imaging of the abdomen and pelvis was performed using the standard protocol during bolus administration of intravenous contrast. CONTRAST:  62mL OMNIPAQUE IOHEXOL 350 MG/ML SOLN IV. No oral contrast. COMPARISON:  None FINDINGS: CTA CHEST FINDINGS Cardiovascular: Mild atherosclerotic calcification aorta. Aorta normal caliber without aneurysm or dissection. Heart unremarkable. No pericardial effusion. Pulmonary arteries adequately opacified. Scattered respiratory motion artifacts. No definite pulmonary emboli. Mediastinum/Nodes: Esophagus unremarkable. No thoracic adenopathy. Base of cervical region normal appearance. Lungs/Pleura: Extensive BILATERAL airspace infiltrates consistent with multifocal pneumonia and COVID-19. No pleural effusion or pneumothorax. No dominant pulmonary mass. Musculoskeletal: Scattered endplate spur formation thoracic spine. Review of the MIP images confirms the above findings. CT ABDOMEN and PELVIS FINDINGS Hepatobiliary: Mild fatty infiltration of liver. Gallbladder and liver otherwise normal appearance. Pancreas: Normal appearance Spleen: Normal appearance Adrenals/Urinary Tract: Adrenal glands and RIGHT kidney normal appearance. Mild atrophy and minimal scarring LEFT kidney. No renal masses or hydronephrosis. No urinary tract calcification or dilatation. Bladder unremarkable. Stomach/Bowel: Fluid in colon and rectum consistent with history of diarrhea. Slight gaseous distention of transverse colon, nonspecific. No bowel wall thickening or evidence of obstruction. Stomach unremarkable. Vascular/Lymphatic: Aorta normal caliber.  No  adenopathy. Reproductive: Unremarkable prostate gland and seminal vesicles Other: Small RIGHT inguinal hernia containing fat. No free air or free fluid. No additional inflammatory process. Musculoskeletal: Degenerative disc disease changes lumbar spine. Review of the MIP images confirms the above findings. IMPRESSION: No evidence of pulmonary embolism. Extensive BILATERAL airspace infiltrates consistent with multifocal pneumonia and COVID-19. Small amount of fluid within the colon and rectum consistent with history of diarrhea. No acute intra-abdominal or intrapelvic abnormalities. Small RIGHT inguinal hernia containing fat. Aortic Atherosclerosis (ICD10-I70.0). Electronically Signed   By: Ulyses Southward M.D.   On: 06/05/2020 10:32    Scheduled Meds: . albuterol  2 puff Inhalation Q6H  . vitamin C  500 mg Oral Daily  . baricitinib  4 mg Oral Daily  . cephALEXin  500 mg Oral Q6H  . enoxaparin (LOVENOX) injection  60 mg Subcutaneous Q24H  . methylPREDNISolone (SOLU-MEDROL) injection  60 mg Intravenous TID  . saccharomyces boulardii  250 mg Oral BID  . zinc sulfate  220 mg Oral Daily   Continuous Infusions:  PRN Meds: acetaminophen, chlorpheniramine-HYDROcodone, guaiFENesin-dextromethorphan, loperamide, ondansetron **OR** ondansetron (ZOFRAN) IV  Time spent: 35 minutes  Author: Lynden Oxford, MD [facility] 06/05/2020 7:39 PM  To reach On-call, see care teams to locate the attending and reach out via [email or web address]. Between 7PM-7AM, please contact night-coverage If you still have difficulty reaching the attending provider, please page the [facility] (Director on Call) for [facility] on amion for assistance.

## 2020-06-06 LAB — CBC WITH DIFFERENTIAL/PLATELET
Abs Immature Granulocytes: 0.74 10*3/uL — ABNORMAL HIGH (ref 0.00–0.07)
Basophils Absolute: 0.1 10*3/uL (ref 0.0–0.1)
Basophils Relative: 0 %
Eosinophils Absolute: 0 10*3/uL (ref 0.0–0.5)
Eosinophils Relative: 0 %
HCT: 41.5 % (ref 39.0–52.0)
Hemoglobin: 15.4 g/dL (ref 13.0–17.0)
Immature Granulocytes: 5 %
Lymphocytes Relative: 7 %
Lymphs Abs: 1.1 10*3/uL (ref 0.7–4.0)
MCH: 30.3 pg (ref 26.0–34.0)
MCHC: 37.1 g/dL — ABNORMAL HIGH (ref 30.0–36.0)
MCV: 81.7 fL (ref 80.0–100.0)
Monocytes Absolute: 0.8 10*3/uL (ref 0.1–1.0)
Monocytes Relative: 6 %
Neutro Abs: 11.5 10*3/uL — ABNORMAL HIGH (ref 1.7–7.7)
Neutrophils Relative %: 82 %
Platelets: 458 10*3/uL — ABNORMAL HIGH (ref 150–400)
RBC: 5.08 MIL/uL (ref 4.22–5.81)
RDW: 13.7 % (ref 11.5–15.5)
WBC: 14.1 10*3/uL — ABNORMAL HIGH (ref 4.0–10.5)
nRBC: 0 % (ref 0.0–0.2)

## 2020-06-06 LAB — COMPREHENSIVE METABOLIC PANEL
ALT: 80 U/L — ABNORMAL HIGH (ref 0–44)
AST: 38 U/L (ref 15–41)
Albumin: 3.4 g/dL — ABNORMAL LOW (ref 3.5–5.0)
Alkaline Phosphatase: 77 U/L (ref 38–126)
Anion gap: 10 (ref 5–15)
BUN: 21 mg/dL — ABNORMAL HIGH (ref 6–20)
CO2: 24 mmol/L (ref 22–32)
Calcium: 8.3 mg/dL — ABNORMAL LOW (ref 8.9–10.3)
Chloride: 102 mmol/L (ref 98–111)
Creatinine, Ser: 1.07 mg/dL (ref 0.61–1.24)
GFR calc Af Amer: >60 mL/min (ref >60)
GFR calc non Af Amer: >60 mL/min (ref >60)
Glucose, Bld: 179 mg/dL — ABNORMAL HIGH (ref 70–99)
Potassium: 3.6 mmol/L (ref 3.5–5.1)
Sodium: 136 mmol/L (ref 135–145)
Total Bilirubin: 1.3 mg/dL — ABNORMAL HIGH (ref 0.3–1.2)
Total Protein: 6.7 g/dL (ref 6.5–8.1)

## 2020-06-06 LAB — FIBRIN DERIVATIVES D-DIMER (ARMC ONLY): Fibrin derivatives D-dimer (ARMC): 667.98 ng/mL (FEU) — ABNORMAL HIGH (ref 0.00–499.00)

## 2020-06-06 LAB — MAGNESIUM: Magnesium: 2.8 mg/dL — ABNORMAL HIGH (ref 1.7–2.4)

## 2020-06-06 LAB — C-REACTIVE PROTEIN: CRP: 1.4 mg/dL — ABNORMAL HIGH (ref <1.0)

## 2020-06-06 MED ORDER — FUROSEMIDE 10 MG/ML IJ SOLN
20.0000 mg | Freq: Two times a day (BID) | INTRAMUSCULAR | Status: DC
  Administered 2020-06-06 – 2020-06-19 (×26): 20 mg via INTRAVENOUS
  Filled 2020-06-06 (×26): qty 2

## 2020-06-06 MED ORDER — DIPHENOXYLATE-ATROPINE 2.5-0.025 MG PO TABS
1.0000 | ORAL_TABLET | Freq: Four times a day (QID) | ORAL | Status: DC | PRN
  Administered 2020-06-06: 1 via ORAL
  Filled 2020-06-06: qty 1

## 2020-06-06 NOTE — Progress Notes (Signed)
[facility] Progress Note  Patient: Dwayne Dalton    BPZ:[id number]  DOA: 05/30/2020     Date of Service: the patient was seen and examined on 06/06/2020  Brief hospital course: Past medical history of developmental delay. Presents with shortness of breath and found to have COVID-19 pneumonia. Currently plan is treat hypoxia.  Assessment and Plan: 1. Acute COVID-19 Viral Pneumonia Acute hypoxic respiratory failure.  POA  CXR: hazy bilateral peripheral opacities CT PE protocol 06/05/2020: negative for PE. Oxygen requirements: On 12 LPM, oxygenation worsened on 06/04/2020 night. CRP: 1.5 from 12.2, went up to 2.9 again. Remdesivir: Started on 05/30/2020. Steroids: Started on 05/30/2020. Baricitinib/Actemra(off-label use): Discussed with the patient. due to rapid worsening of patient's oxygenation without any improvement. Started on 06/05/2020.  The investigational nature of this medication was discussed with the patient/HCPOA and they choose to proceed as the potential benefits are felt to outweigh risks at this time.  Antibiotics: Keflex for cellulitis completed on 06/06/2020. Vitamin C and Zinc: Continue DVT Prophylaxis: Place and maintain sequential compression device Start: 05/31/20 1358 Weight-based Lovenox Prone positioning: Patient encouraged to stay in prone position as much as possible.  The treatment plan and use of medications and known side effects were discussed with patient/family. It was clearly explained that Complete risks and long-term side effects are unknown, however in the best clinical judgment they seem to be of some clinical benefit rather than medical risks. Patient/family agree with the treatment plan and want to receive these treatments as indicated.   2. Bilateral lower extremity edema Chronic per patient. Continue SCD  3. Obesity, morbid Placing the patient at high risk for poor outcomes with COVID-19 Body mass index is 43.07 kg/m.   4.   Diarrhea. Florastor.    Change Imodium to Lomotil.  5.  Left lower extremity cellulitis. Added Keflex.  Last day 06/06/2020. Currently resolved.  6.  Developmental delay. Difficulty understanding of his current hypoxia.  Diet: Regular diet DVT Prophylaxis: Lovenox 60 mg every 24 hr Place and maintain sequential compression device Start: 05/31/20 1358   Advance goals of care discussion: Full code  Family Communication: no family was present at bedside, at the time of interview.  The pt provided permission to discuss medical plan with the family.  Discussed with father on the phone.  Opportunity was given to ask question and all questions were answered satisfactorily.   Disposition:  Status is: Inpatient  Remains inpatient appropriate because: Continues to have severe hypoxia requiring 13L of oxygen.  Also has diarrhea.  Dispo: The patient is from: Home              Anticipated d/c is to: Home              Anticipated d/c date is: > 3 days              Patient currently is not medically stable to d/c.  Subjective: Continues to have diarrhea.  No nausea no vomiting.  No abdominal pain.  No fever no chills.  Continues to have shortness of breath.  Physical Exam:  General: Appear in no distress, no Rash; Oral Mucosa Clear, moist. no Abnormal Neck Mass Or lumps, Conjunctiva normal  Cardiovascular: S1 and S2 Present, no Murmur, Respiratory: good respiratory effort, Bilateral Air entry present and no Crackles, no wheezes Abdomen: Bowel Sound present, Soft and no tenderness Extremities: bilateral Pedal edema Neurology: alert and oriented to time, place, and person affect appropriate. no new focal deficit Gait not checked  due to patient safety concerns  Vitals:   06/06/20 1149 06/06/20 1501 06/06/20 1542 06/06/20 1633  BP: 128/79  (!) 142/88   Pulse: 72 82 73   Resp: 17  (!) 22   Temp: 98.2 F (36.8 C)  98.3 F (36.8 C)   TempSrc:   Oral   SpO2: 98% 91% 96% 93%  Weight:       Height:        Intake/Output Summary (Last 24 hours) at 06/06/2020 1719 Last data filed at 06/06/2020 1200 Gross per 24 hour  Intake 240 ml  Output 175 ml  Net 65 ml   Filed Weights   05/30/20 2052  Weight: 124.7 kg    Data Reviewed: I have personally reviewed and interpreted daily labs, tele strips, imagings as discussed above. I reviewed all nursing notes, pharmacy notes, vitals, pertinent old records I have discussed plan of care as described above with RN and patient/family.  CBC: Recent Labs  Lab 06/02/20 0705 06/03/20 0411 06/04/20 0359 06/05/20 0642 06/06/20 1045  WBC 6.3 6.9 9.5 10.6* 14.1*  NEUTROABS 5.2 5.8 8.1* 9.1* 11.5*  HGB 15.4 14.8 14.5 14.8 15.4  HCT 42.5 41.7 41.5 41.4 41.5  MCV 84.8 85.1 85.2 84.8 81.7  PLT 179 214 273 335 458*   Basic Metabolic Panel: Recent Labs  Lab 06/02/20 0705 06/03/20 0411 06/04/20 0359 06/05/20 0642 06/06/20 1045  NA 140 140 137 138 136  K 3.3* 3.3* 2.9* 3.5 3.6  CL 107 106 103 104 102  CO2 27 26 28  20* 24  GLUCOSE 159* 169* 157* 149* 179*  BUN 20 22* 21* 22* 21*  CREATININE 1.15 1.00 0.93 0.96 1.07  CALCIUM 8.5* 7.9* 8.0* 7.9* 8.3*  MG 2.7* 2.7* 2.5* 2.4 2.8*    Studies: No results found.  Scheduled Meds: . albuterol  2 puff Inhalation Q6H  . vitamin C  500 mg Oral Daily  . baricitinib  4 mg Oral Daily  . cephALEXin  500 mg Oral Q6H  . enoxaparin (LOVENOX) injection  60 mg Subcutaneous Q24H  . methylPREDNISolone (SOLU-MEDROL) injection  60 mg Intravenous TID  . saccharomyces boulardii  250 mg Oral BID  . zinc sulfate  220 mg Oral Daily   Continuous Infusions: PRN Meds: acetaminophen, chlorpheniramine-HYDROcodone, diphenoxylate-atropine, guaiFENesin-dextromethorphan, ondansetron **OR** ondansetron (ZOFRAN) IV  Time spent: 35 minutes  Author: , MD [facility] 06/06/2020 5:19 PM  To reach On-call, see care teams to locate the attending and reach out via [email or web address]. Between  7PM-7AM, please contact night-coverage If you still have difficulty reaching the attending provider, please page the [facility] (Director on Call) for [facility] on amion for assistance.

## 2020-06-06 NOTE — Progress Notes (Signed)
Daily update given to his father.

## 2020-06-06 NOTE — Progress Notes (Signed)
PT Cancellation Note  Patient Details Name: Dwayne Dalton MRN: [id number] DOB: 12/27/1965   Cancelled Treatment:     Per discussion with RN. Pt has sat EOB several times during the day today and pt requesting to rest. Has required increase in oxygen demand. Acute PT will continue efforts and return at later date/time.   Rushie Chestnut 06/06/2020, 3:17 PM

## 2020-06-07 DIAGNOSIS — N179 Acute kidney failure, unspecified: Secondary | ICD-10-CM

## 2020-06-07 DIAGNOSIS — R0602 Shortness of breath: Secondary | ICD-10-CM

## 2020-06-07 DIAGNOSIS — A0839 Other viral enteritis: Secondary | ICD-10-CM

## 2020-06-07 LAB — FIBRIN DERIVATIVES D-DIMER (ARMC ONLY): Fibrin derivatives D-dimer (ARMC): 547.57 ng/mL (FEU) — ABNORMAL HIGH (ref 0.00–499.00)

## 2020-06-07 LAB — PHOSPHORUS: Phosphorus: 4.2 mg/dL (ref 2.5–4.6)

## 2020-06-07 LAB — CBC WITH DIFFERENTIAL/PLATELET
Abs Immature Granulocytes: 0.81 10*3/uL — ABNORMAL HIGH (ref 0.00–0.07)
Basophils Absolute: 0.1 10*3/uL (ref 0.0–0.1)
Basophils Relative: 0 %
Eosinophils Absolute: 0 10*3/uL (ref 0.0–0.5)
Eosinophils Relative: 0 %
HCT: 41.2 % (ref 39.0–52.0)
Hemoglobin: 14.9 g/dL (ref 13.0–17.0)
Immature Granulocytes: 5 %
Lymphocytes Relative: 6 %
Lymphs Abs: 0.9 10*3/uL (ref 0.7–4.0)
MCH: 30.7 pg (ref 26.0–34.0)
MCHC: 36.2 g/dL — ABNORMAL HIGH (ref 30.0–36.0)
MCV: 84.8 fL (ref 80.0–100.0)
Monocytes Absolute: 0.8 10*3/uL (ref 0.1–1.0)
Monocytes Relative: 5 %
Neutro Abs: 12.9 10*3/uL — ABNORMAL HIGH (ref 1.7–7.7)
Neutrophils Relative %: 84 %
Platelets: 499 10*3/uL — ABNORMAL HIGH (ref 150–400)
RBC: 4.86 MIL/uL (ref 4.22–5.81)
RDW: 13.5 % (ref 11.5–15.5)
WBC: 15.4 10*3/uL — ABNORMAL HIGH (ref 4.0–10.5)
nRBC: 0.1 % (ref 0.0–0.2)

## 2020-06-07 LAB — FERRITIN: Ferritin: 1055 ng/mL — ABNORMAL HIGH (ref 24–336)

## 2020-06-07 LAB — COMPREHENSIVE METABOLIC PANEL
ALT: 71 U/L — ABNORMAL HIGH (ref 0–44)
AST: 34 U/L (ref 15–41)
Albumin: 3.3 g/dL — ABNORMAL LOW (ref 3.5–5.0)
Alkaline Phosphatase: 69 U/L (ref 38–126)
Anion gap: 11 (ref 5–15)
BUN: 28 mg/dL — ABNORMAL HIGH (ref 6–20)
CO2: 25 mmol/L (ref 22–32)
Calcium: 8.4 mg/dL — ABNORMAL LOW (ref 8.9–10.3)
Chloride: 100 mmol/L (ref 98–111)
Creatinine, Ser: 1.08 mg/dL (ref 0.61–1.24)
GFR calc Af Amer: >60 mL/min (ref >60)
GFR calc non Af Amer: >60 mL/min (ref >60)
Glucose, Bld: 166 mg/dL — ABNORMAL HIGH (ref 70–99)
Potassium: 3.6 mmol/L (ref 3.5–5.1)
Sodium: 136 mmol/L (ref 135–145)
Total Bilirubin: 1.4 mg/dL — ABNORMAL HIGH (ref 0.3–1.2)
Total Protein: 6.3 g/dL — ABNORMAL LOW (ref 6.5–8.1)

## 2020-06-07 LAB — MAGNESIUM: Magnesium: 2.8 mg/dL — ABNORMAL HIGH (ref 1.7–2.4)

## 2020-06-07 LAB — C-REACTIVE PROTEIN: CRP: 0.7 mg/dL (ref <1.0)

## 2020-06-07 LAB — LACTATE DEHYDROGENASE: LDH: 706 U/L — ABNORMAL HIGH (ref 98–192)

## 2020-06-07 MED ORDER — TRAZODONE HCL 50 MG PO TABS
50.0000 mg | ORAL_TABLET | Freq: Every evening | ORAL | Status: DC | PRN
  Administered 2020-06-07 – 2020-06-16 (×9): 50 mg via ORAL
  Filled 2020-06-07 (×9): qty 1

## 2020-06-07 MED ORDER — IPRATROPIUM-ALBUTEROL 20-100 MCG/ACT IN AERS
1.0000 | INHALATION_SPRAY | Freq: Four times a day (QID) | RESPIRATORY_TRACT | Status: DC
  Administered 2020-06-07 – 2020-06-19 (×46): 1 via RESPIRATORY_TRACT
  Filled 2020-06-07: qty 4

## 2020-06-07 NOTE — Progress Notes (Signed)
Physical Therapy Treatment Patient Details Name: Dwayne Dalton MRN: [id number] DOB: [birth year] Today's Date: 06/07/2020    History of Present Illness per MD note:Dwayne Dalton is a 54 y.o. male with no significant medical history who presents with a 1 week history of fever chills cough and shortness of breath as well as chest pain now with associated diarrhea.  Chest pain is worse on taking a deep breath.  Patient is unvaccinated against Covid.  ED Course: On arrival febrile at 102.7, tachycardia at 110, tachypneic at 28 with O2 sats 93% on room air.  Covid positive.  Chest x-ray consistent with viral pneumonia.  Blood work showing mild hyponatremia of 129 with mild AKI, creatinine 1.26 with bicarb of 17. Lactic acid 1.5.  Patient was hydrated in the ER.     PT Comments    Pt was supine in bed upon arriving. He reports feeling well and states, " I just want to go home." Currently on 15 L HFNC with sao2 98%. He was able to sit up on R side of bed without physical assistance. Pt remained on 15 L HFNC throughout session with only one occasion of de-sating to 82%. He was able to stand to RW 4 x at EOB and perform standing marching. Poor foot clearance during marching however HR and O2 remained stable. Pt is very talkative and conversational throughout. Requires redirecting to focus on desired task. No SOB or fatigue reported from pt. Prolonged seated rest between standing with education on exercises for pt to perform to promote strengthening. Pt states he has had balance problems for many years. And did have one episode of LOB while static standing in which therapist intervention to prevent fall.  Recommend DC to SNF to address deficits however if pt continues to demonstrate improve respiratory function, may progress to Home with HHPT. Currently SNF appropriate.      Follow Up Recommendations  Supervision for mobility/OOB;SNF     Equipment Recommendations  Rolling walker with 5" wheels     Recommendations for Other Services       Precautions / Restrictions Precautions Precautions: None Restrictions Weight Bearing Restrictions: No    Mobility  Bed Mobility Overal bed mobility: Modified Independent Bed Mobility: Supine to Sit;Sit to Supine     Supine to sit: Modified independent (Device/Increase time) Sit to supine: Modified independent (Device/Increase time)   General bed mobility comments: pt was able to progress from supine to/from sitting without physical assistance  Transfers Overall transfer level: Modified independent Equipment used: Rolling walker (2 wheeled)             General transfer comment: Pt performed STS 4 x from EOB to RW. pt has no SOB or difficulty. did c/o BLE "stiffness" and does have one episode of LOB in standing  Ambulation/Gait             General Gait Details: pt on 15 L HFNC throughout. unable to progress away form EOB 2/2 to equipment    Stairs             Wheelchair Mobility    Modified Rankin (Stroke Patients Only)       Balance                                            Cognition Arousal/Alertness: Awake/alert Behavior During Therapy: WFL for tasks assessed/performed Overall Cognitive Status: Within Functional  Limits for tasks assessed                                 General Comments: Pt is A and O x 4      Exercises      General Comments        Pertinent Vitals/Pain Pain Assessment: No/denies pain    Home Living                      Prior Function            PT Goals (current goals can now be found in the care plan section) Acute Rehab PT Goals Patient Stated Goal: go home Progress towards PT goals: Progressing toward goals    Frequency    Min 2X/week      PT Plan Current plan remains appropriate    Co-evaluation              AM-PAC PT "6 Clicks" Mobility   Outcome Measure  Help needed turning from your back to your  side while in a flat bed without using bedrails?: None Help needed moving from lying on your back to sitting on the side of a flat bed without using bedrails?: None Help needed moving to and from a bed to a chair (including a wheelchair)?: A Little Help needed standing up from a chair using your arms (e.g., wheelchair or bedside chair)?: A Little Help needed to walk in hospital room?: A Lot Help needed climbing 3-5 steps with a railing? : A Lot 6 Click Score: 18    End of Session Equipment Utilized During Treatment: Gait belt;Oxygen Activity Tolerance: Patient tolerated treatment well Patient left: in bed Nurse Communication: Mobility status PT Visit Diagnosis: Muscle weakness (generalized) (M62.81);Difficulty in walking, not elsewhere classified (R26.2)     Time: 7989-2119 PT Time Calculation (min) (ACUTE ONLY): 25 min  Charges:  $Therapeutic Activity: 23-37 mins                     Jetta Lout PTA 06/07/20, 4:02 PM

## 2020-06-07 NOTE — Progress Notes (Signed)
Pt turns from side to side with assistance; he is unable to prone.  Utilizes IBD, able to achieve 1750.  He does not c/o SOB, he is talkative and appears comfortable, no use of accessory muscles; however, 02 sats continue to decrease into the low and mid 80's with movement, while on 15L via HFNC.

## 2020-06-07 NOTE — Progress Notes (Addendum)
PROGRESS NOTE    Dwayne Dalton  VWU:[id number] DOB: 1966/04/28 DOA: 05/30/2020 PCP: Patient, No Pcp Per     Brief Narrative:  Dwayne Dalton is a 54 y.o. WM PMHx   Presents with a 1 week history of fever chills cough and shortness of breath as well as chest pain now with associated diarrhea.  Chest pain is worse on taking a deep breath.  Patient is unvaccinated against Covid.  ED Course: On arrival febrile at 102.7, tachycardia at 110, tachypneic at 28 with O2 sats 93% on room air.  Covid positive.  Chest x-ray consistent with viral pneumonia.  Blood work showing mild hyponatremia of 129 with mild AKI, creatinine 1.26 with bicarb of 17. Lactic acid 1.5.  Patient was hydrated in the ER.  Hospitalist consulted for admission.   Subjective: Afebrile overnight A/O x4 slow to answer questions but if given time would respond appropriately.  Was able to ask for his own needs.   Assessment & Plan: Covid vaccination; unvaccinated   Active Problems:   Pneumonia due to COVID-19 virus   Diarrhea due to COVID-19   AKI (acute kidney injury) (HCC)   Metabolic acidosis   Obesity, Class III, BMI 40-49.9 (morbid obesity) (HCC)   COVID-19  Acute respiratory failure with hypoxia/Covid pneumonia COVID-19 Labs  Recent Labs    06/05/20 0642 06/06/20 1045  FERRITIN 1,435*  --   CRP 2.9* 1.4*    Lab Results  Component Value Date   SARSCOV2NAA POSITIVE (A) 05/30/2020   -8/24 remdesivir per pharmacy protocol -8/24 baricitinib x14-day course per pharmacy protocol.  Per EMR HCPOA okayed use of this experimental medicationThe treatment plan and use of medications and known side effects were discussed with patient/family. It was clearly explained that Complete risks and long-term side effects are unknown, however in the best clinical judgment they seem to be of some clinical benefit rather than medical risks. Patient/family agree with the treatment plan and want to receive these treatments as  indicated.  -Vitamin C and zinc per Covid protocol -Combivent QID  Chronic lower extremity edema -Did not appreciate on exam  Obesity, morbid Placing the patient at high risk for poor outcomes with COVID-19 Body mass index is 43.07 kg/m.   Diarrhea. Florastor.  Add Imodium  Left lower extremity cellulitis. Added Keflex.  Developmentally delayed -Easy to reorient -Not sure patient fully understands his situation -Who is his HCPOA?   DVT prophylaxis: Lovenox Code Status: Full Family Communication:  Status is: Inpatient    Dispo: The patient is from: Home              Anticipated d/c is to: Home vs SNF              Anticipated d/c date is:??              Patient currently extremely unstable      Consultants:    Procedures/Significant Events:    I have personally reviewed and interpreted all radiology studies and my findings are as above.  VENTILATOR SETTINGS: HFNC 9/1 Flow 15 L/min SPO2 89%   Cultures   Antimicrobials: Anti-infectives (From admission, onward)   Start     Ordered Stop   06/03/20 0000  cephALEXin (KEFLEX) capsule 500 mg  Status:  Discontinued        06/02/20 2045 06/06/20 1939   06/01/20 1000  remdesivir 100 mg in sodium chloride 0.9 % 100 mL IVPB       "Followed by" Linked Group Details  05/31/20 0144 06/04/20 0926   05/31/20 0145  remdesivir 200 mg in sodium chloride 0.9% 250 mL IVPB       "Followed by" Linked Group Details   05/31/20 0144 05/31/20 0616       Devices    LINES / TUBES:      Continuous Infusions:   Objective: Vitals:   06/07/20 0437 06/07/20 0500 06/07/20 0731 06/07/20 0748  BP: 119/72  138/80   Pulse: 85  81 94  Resp: 20  (!) 24   Temp: 98.9 F (37.2 C)  98.7 F (37.1 C)   TempSrc:   Axillary   SpO2: (!) 86% 90% 92% (!) 89%  Weight:      Height:        Intake/Output Summary (Last 24 hours) at 06/07/2020 7628 Last data filed at 06/07/2020 0700 Gross per 24 hour  Intake 240 ml  Output  875 ml  Net -635 ml   Filed Weights   05/30/20 2052  Weight: 124.7 kg    Examination:  General: A/O x4, positive acute respiratory distress Eyes: negative scleral hemorrhage, negative anisocoria, negative icterus ENT: Negative Runny nose, negative gingival bleeding, Neck:  Negative scars, masses, torticollis, lymphadenopathy, JVD Lungs: Clear to auscultation bilaterally without wheezes or crackles Cardiovascular: Regular rate and rhythm without murmur gallop or rub normal S1 and S2 Abdomen: negative abdominal pain, nondistended, positive soft, bowel sounds, no rebound, no ascites, no appreciable mass Extremities: No significant cyanosis, clubbing, or edema bilateral lower extremities Skin: Negative rashes, lesions, ulcers Psychiatric:  Negative depression, negative anxiety, negative fatigue, negative mania, not entirely sure patient fully understands the seriousness of his disease process. Central nervous system:  Cranial nerves II through XII intact, tongue/uvula midline, all extremities muscle strength 5/5, sensation intact throughout, negative dysarthria, negative expressive aphasia, negative receptive aphasia.  .     Data Reviewed: Care during the described time interval was provided by me .  I have reviewed this patient's available data, including medical history, events of note, physical examination, and all test results as part of my evaluation.  CBC: Recent Labs  Lab 06/03/20 0411 06/04/20 0359 06/05/20 0642 06/06/20 1045 06/07/20 0618  WBC 6.9 9.5 10.6* 14.1* 15.4*  NEUTROABS 5.8 8.1* 9.1* 11.5* 12.9*  HGB 14.8 14.5 14.8 15.4 14.9  HCT 41.7 41.5 41.4 41.5 41.2  MCV 85.1 85.2 84.8 81.7 84.8  PLT 214 273 335 458* 499*   Basic Metabolic Panel: Recent Labs  Lab 06/03/20 0411 06/04/20 0359 06/05/20 0642 06/06/20 1045 06/07/20 0618  NA 140 137 138 136 136  K 3.3* 2.9* 3.5 3.6 3.6  CL 106 103 104 102 100  CO2 26 28 20* 24 25  GLUCOSE 169* 157* 149* 179* 166*    BUN 22* 21* 22* 21* 28*  CREATININE 1.00 0.93 0.96 1.07 1.08  CALCIUM 7.9* 8.0* 7.9* 8.3* 8.4*  MG 2.7* 2.5* 2.4 2.8* 2.8*   GFR: Estimated Creatinine Clearance: 101.3 mL/min (by C-G formula based on SCr of 1.08 mg/dL). Liver Function Tests: Recent Labs  Lab 06/03/20 0411 06/04/20 0359 06/05/20 0642 06/06/20 1045 06/07/20 0618  AST 67* 57* 49* 38 34  ALT 84* 85* 92* 80* 71*  ALKPHOS 74 71 79 77 69  BILITOT 0.8 1.0 1.1 1.3* 1.4*  PROT 6.3* 6.1* 6.3* 6.7 6.3*  ALBUMIN 3.0* 3.0* 3.1* 3.4* 3.3*   No results for input(s): LIPASE, AMYLASE in the last 168 hours. No results for input(s): AMMONIA in the last 168 hours. Coagulation Profile: No  results for input(s): INR, PROTIME in the last 168 hours. Cardiac Enzymes: No results for input(s): CKTOTAL, CKMB, CKMBINDEX, TROPONINI in the last 168 hours. BNP (last 3 results) No results for input(s): PROBNP in the last 8760 hours. HbA1C: No results for input(s): HGBA1C in the last 72 hours. CBG: No results for input(s): GLUCAP in the last 168 hours. Lipid Profile: No results for input(s): CHOL, HDL, LDLCALC, TRIG, CHOLHDL, LDLDIRECT in the last 72 hours. Thyroid Function Tests: No results for input(s): TSH, T4TOTAL, FREET4, T3FREE, THYROIDAB in the last 72 hours. Anemia Panel: Recent Labs    06/05/20 0642  FERRITIN 1,435*   Sepsis Labs: No results for input(s): PROCALCITON, LATICACIDVEN in the last 168 hours.  Recent Results (from the past 240 hour(s))  SARS Coronavirus 2 by RT PCR (hospital order, performed in [facility] hospital lab) Nasopharyngeal Nasopharyngeal Swab     Status: Abnormal   Collection Time: 05/30/20  9:11 PM   Specimen: Nasopharyngeal Swab  Result Value Ref Range Status   SARS Coronavirus 2 POSITIVE (A) NEGATIVE Corrected    Comment: RESULT CALLED TO, READ BACK BY AND VERIFIED WITH: ASHLEY ORSUTO @2215  ON 05/27/20 SKL (NOTE) SARS-CoV-2 target nucleic acids are DETECTED  SARS-CoV-2 RNA is generally  detectable in upper respiratory specimens  during the acute phase of infection.  Positive results are indicative  of the presence of the identified virus, but do not rule out bacterial infection or co-infection with other pathogens not detected by the test.  Clinical correlation with patient history and  other diagnostic information is necessary to determine patient infection status.  The expected result is negative.  Fact Sheet for Patients:   05/29/20   Fact Sheet for Healthcare Providers:   BoilerBrush.com.cy    This test is not yet approved or cleared by the https://pope.com/ FDA and  has been authorized for detection and/or diagnosis of SARS-CoV-2 by FDA under an Emergency Use Authorization (EUA).  This EUA will remain in effect (meaning this te st can be used) for the duration of  the COVID-19 declaration under Section 564(b)(1) of the Act, 21 U.S.C. section 360-bbb-3(b)(1), unless the authorization is terminated or revoked sooner.  Performed at [facility], [address] [place], [place] CORRECTED ON 08/24 AT 2222: PREVIOUSLY REPORTED AS POSITIVE RESULT CALLED TO, READ BACK BY AND VERIFIED WITH: ASHLEY ORSUTO 22215 ON 05/27/20 SKL   Culture, blood (routine x 2)     Status: None   Collection Time: 05/30/20 11:36 PM   Specimen: BLOOD  Result Value Ref Range Status   Specimen Description BLOOD LAC  Final   Special Requests BOTTLES DRAWN AEROBIC AND ANAEROBIC BCAV  Final   Culture   Final    NO GROWTH 5 DAYS Performed at [facility], [address], [place], [place]    Report Status 06/05/2020 FINAL  Final  Culture, blood (routine x 2)     Status: None   Collection Time: 05/30/20 11:36 PM   Specimen: BLOOD  Result Value Ref Range Status   Specimen Description BLOOD RAC  Final   Special Requests BOTTLES DRAWN AEROBIC AND ANAEROBIC BCAV  Final   Culture   Final    NO GROWTH 5  DAYS Performed at [facility], [address] [place], [place]    Report Status 06/05/2020 FINAL  Final         Radiology Studies: CT ANGIO CHEST PE W OR WO CONTRAST  Result Date: 06/05/2020  CLINICAL DATA:  COVID-19, shortness of breath, suspected pulmonary embolism, diarrhea EXAM: CT ANGIOGRAPHY CHEST CT ABDOMEN AND PELVIS WITH CONTRAST TECHNIQUE: Multidetector CT imaging of the chest was performed using the standard protocol during bolus administration of intravenous contrast. Multiplanar CT image reconstructions and MIPs were obtained to evaluate the vascular anatomy. Multidetector CT imaging of the abdomen and pelvis was performed using the standard protocol during bolus administration of intravenous contrast. CONTRAST:  75mL OMNIPAQUE IOHEXOL 350 MG/ML SOLN IV. No oral contrast. COMPARISON:  None FINDINGS: CTA CHEST FINDINGS Cardiovascular: Mild atherosclerotic calcification aorta. Aorta normal caliber without aneurysm or dissection. Heart unremarkable. No pericardial effusion. Pulmonary arteries adequately opacified. Scattered respiratory motion artifacts. No definite pulmonary emboli. Mediastinum/Nodes: Esophagus unremarkable. No thoracic adenopathy. Base of cervical region normal appearance. Lungs/Pleura: Extensive BILATERAL airspace infiltrates consistent with multifocal pneumonia and COVID-19. No pleural effusion or pneumothorax. No dominant pulmonary mass. Musculoskeletal: Scattered endplate spur formation thoracic spine. Review of the MIP images confirms the above findings. CT ABDOMEN and PELVIS FINDINGS Hepatobiliary: Mild fatty infiltration of liver. Gallbladder and liver otherwise normal appearance. Pancreas: Normal appearance Spleen: Normal appearance Adrenals/Urinary Tract: Adrenal glands and RIGHT kidney normal appearance. Mild atrophy and minimal scarring LEFT kidney. No renal masses or hydronephrosis. No urinary tract calcification or dilatation. Bladder  unremarkable. Stomach/Bowel: Fluid in colon and rectum consistent with history of diarrhea. Slight gaseous distention of transverse colon, nonspecific. No bowel wall thickening or evidence of obstruction. Stomach unremarkable. Vascular/Lymphatic: Aorta normal caliber.  No adenopathy. Reproductive: Unremarkable prostate gland and seminal vesicles Other: Small RIGHT inguinal hernia containing fat. No free air or free fluid. No additional inflammatory process. Musculoskeletal: Degenerative disc disease changes lumbar spine. Review of the MIP images confirms the above findings. IMPRESSION: No evidence of pulmonary embolism. Extensive BILATERAL airspace infiltrates consistent with multifocal pneumonia and COVID-19. Small amount of fluid within the colon and rectum consistent with history of diarrhea. No acute intra-abdominal or intrapelvic abnormalities. Small RIGHT inguinal hernia containing fat. Aortic Atherosclerosis (ICD10-I70.0). Electronically Signed   By: Ulyses SouthwardMark  Boles M.D.   On: 06/05/2020 10:32   CT ABDOMEN PELVIS W CONTRAST  Result Date: 06/05/2020 CLINICAL DATA:  COVID-19, shortness of breath, suspected pulmonary embolism, diarrhea EXAM: CT ANGIOGRAPHY CHEST CT ABDOMEN AND PELVIS WITH CONTRAST TECHNIQUE: Multidetector CT imaging of the chest was performed using the standard protocol during bolus administration of intravenous contrast. Multiplanar CT image reconstructions and MIPs were obtained to evaluate the vascular anatomy. Multidetector CT imaging of the abdomen and pelvis was performed using the standard protocol during bolus administration of intravenous contrast. CONTRAST:  75mL OMNIPAQUE IOHEXOL 350 MG/ML SOLN IV. No oral contrast. COMPARISON:  None FINDINGS: CTA CHEST FINDINGS Cardiovascular: Mild atherosclerotic calcification aorta. Aorta normal caliber without aneurysm or dissection. Heart unremarkable. No pericardial effusion. Pulmonary arteries adequately opacified. Scattered respiratory motion  artifacts. No definite pulmonary emboli. Mediastinum/Nodes: Esophagus unremarkable. No thoracic adenopathy. Base of cervical region normal appearance. Lungs/Pleura: Extensive BILATERAL airspace infiltrates consistent with multifocal pneumonia and COVID-19. No pleural effusion or pneumothorax. No dominant pulmonary mass. Musculoskeletal: Scattered endplate spur formation thoracic spine. Review of the MIP images confirms the above findings. CT ABDOMEN and PELVIS FINDINGS Hepatobiliary: Mild fatty infiltration of liver. Gallbladder and liver otherwise normal appearance. Pancreas: Normal appearance Spleen: Normal appearance Adrenals/Urinary Tract: Adrenal glands and RIGHT kidney normal appearance. Mild atrophy and minimal scarring LEFT kidney. No renal masses or hydronephrosis. No urinary tract calcification or dilatation. Bladder unremarkable. Stomach/Bowel: Fluid in colon and rectum consistent with history of diarrhea.  Slight gaseous distention of transverse colon, nonspecific. No bowel wall thickening or evidence of obstruction. Stomach unremarkable. Vascular/Lymphatic: Aorta normal caliber.  No adenopathy. Reproductive: Unremarkable prostate gland and seminal vesicles Other: Small RIGHT inguinal hernia containing fat. No free air or free fluid. No additional inflammatory process. Musculoskeletal: Degenerative disc disease changes lumbar spine. Review of the MIP images confirms the above findings. IMPRESSION: No evidence of pulmonary embolism. Extensive BILATERAL airspace infiltrates consistent with multifocal pneumonia and COVID-19. Small amount of fluid within the colon and rectum consistent with history of diarrhea. No acute intra-abdominal or intrapelvic abnormalities. Small RIGHT inguinal hernia containing fat. Aortic Atherosclerosis (ICD10-I70.0). Electronically Signed   By: Ulyses Southward M.D.   On: 06/05/2020 10:32        Scheduled Meds: . albuterol  2 puff Inhalation Q6H  . vitamin C  500 mg Oral Daily   . baricitinib  4 mg Oral Daily  . enoxaparin (LOVENOX) injection  60 mg Subcutaneous Q24H  . furosemide  20 mg Intravenous BID  . methylPREDNISolone (SOLU-MEDROL) injection  60 mg Intravenous TID  . saccharomyces boulardii  250 mg Oral BID  . zinc sulfate  220 mg Oral Daily   Continuous Infusions:   LOS: 7 days    Time spent:40 min    Kasi Lasky, Roselind Messier, MD [facility] Pager [phone]  If 7PM-7AM, please contact night-coverage [email or web address] Password [facility] 06/07/2020, 8:33 AM

## 2020-06-07 NOTE — Progress Notes (Signed)
Pt AAox4, Pt turns from side to side with assistance; attempting to prone today with improvement in oxygen saturations to 95% on 15L via HFNC. Marland Kitchen  Utilizes IBD, able to achieve 1750.  He does not c/o SOB, he is talkative and appears comfortable, no use of accessory muscles; however, 02 sats continue to decrease into the low and mid 80's with movement, while on 15L via HFNC. Pt with no complaints today. Pt has good appetite. Per pt family does not need to be updated as he has updated them. Will continue to monitor

## 2020-06-08 LAB — COMPREHENSIVE METABOLIC PANEL
ALT: 69 U/L — ABNORMAL HIGH (ref 0–44)
AST: 31 U/L (ref 15–41)
Albumin: 3.4 g/dL — ABNORMAL LOW (ref 3.5–5.0)
Alkaline Phosphatase: 71 U/L (ref 38–126)
Anion gap: 6 (ref 5–15)
BUN: 29 mg/dL — ABNORMAL HIGH (ref 6–20)
CO2: 31 mmol/L (ref 22–32)
Calcium: 8.4 mg/dL — ABNORMAL LOW (ref 8.9–10.3)
Chloride: 100 mmol/L (ref 98–111)
Creatinine, Ser: 0.86 mg/dL (ref 0.61–1.24)
GFR calc Af Amer: >60 mL/min (ref >60)
GFR calc non Af Amer: >60 mL/min (ref >60)
Glucose, Bld: 176 mg/dL — ABNORMAL HIGH (ref 70–99)
Potassium: 3.8 mmol/L (ref 3.5–5.1)
Sodium: 137 mmol/L (ref 135–145)
Total Bilirubin: 1 mg/dL (ref 0.3–1.2)
Total Protein: 6.3 g/dL — ABNORMAL LOW (ref 6.5–8.1)

## 2020-06-08 LAB — FERRITIN: Ferritin: 890 ng/mL — ABNORMAL HIGH (ref 24–336)

## 2020-06-08 LAB — CBC WITH DIFFERENTIAL/PLATELET
Abs Immature Granulocytes: 0.53 10*3/uL — ABNORMAL HIGH (ref 0.00–0.07)
Basophils Absolute: 0 10*3/uL (ref 0.0–0.1)
Basophils Relative: 0 %
Eosinophils Absolute: 0 10*3/uL (ref 0.0–0.5)
Eosinophils Relative: 0 %
HCT: 42 % (ref 39.0–52.0)
Hemoglobin: 15.5 g/dL (ref 13.0–17.0)
Immature Granulocytes: 4 %
Lymphocytes Relative: 5 %
Lymphs Abs: 0.7 10*3/uL (ref 0.7–4.0)
MCH: 30.2 pg (ref 26.0–34.0)
MCHC: 36.9 g/dL — ABNORMAL HIGH (ref 30.0–36.0)
MCV: 81.9 fL (ref 80.0–100.0)
Monocytes Absolute: 0.6 10*3/uL (ref 0.1–1.0)
Monocytes Relative: 4 %
Neutro Abs: 12.6 10*3/uL — ABNORMAL HIGH (ref 1.7–7.7)
Neutrophils Relative %: 87 %
Platelets: 520 10*3/uL — ABNORMAL HIGH (ref 150–400)
RBC: 5.13 MIL/uL (ref 4.22–5.81)
RDW: 13.4 % (ref 11.5–15.5)
WBC: 14.5 10*3/uL — ABNORMAL HIGH (ref 4.0–10.5)
nRBC: 0.1 % (ref 0.0–0.2)

## 2020-06-08 LAB — C-REACTIVE PROTEIN: CRP: 0.6 mg/dL (ref <1.0)

## 2020-06-08 LAB — PHOSPHORUS: Phosphorus: 4.5 mg/dL (ref 2.5–4.6)

## 2020-06-08 LAB — FIBRIN DERIVATIVES D-DIMER (ARMC ONLY): Fibrin derivatives D-dimer (ARMC): 424.36 ng/mL (FEU) (ref 0.00–499.00)

## 2020-06-08 LAB — LACTATE DEHYDROGENASE: LDH: 566 U/L — ABNORMAL HIGH (ref 98–192)

## 2020-06-08 LAB — MAGNESIUM: Magnesium: 2.8 mg/dL — ABNORMAL HIGH (ref 1.7–2.4)

## 2020-06-08 NOTE — Progress Notes (Signed)
Pt AAox4, Pt turns from side to side with assistance; Pt on 5L HFNC since this afternoon PT session maintaining sats at rest to 95%. Pt desats to 70s when oxygen removed.Utilizes IBD, able to achieve 1750. He does not c/o SOB, he is talkative and appears comfortable, no use of accessory muscles;Pt with no complaints today. No adverse events or acute changes. Pt has good appetite. Per pt family does not need to be updated as he has updated them. Will continue to monitor

## 2020-06-08 NOTE — Progress Notes (Signed)
Physical Therapy Treatment Patient Details Name: Dwayne Dalton MRN: [id number] DOB: 1966/07/25 Today's Date: 06/08/2020    History of Present Illness per MD note:Dwayne Dalton is a 54 y.o. male with no significant medical history who presents with a 1 week history of fever chills cough and shortness of breath as well as chest pain now with associated diarrhea.  Chest pain is worse on taking a deep breath.  Patient is unvaccinated against Covid.  ED Course: On arrival febrile at 102.7, tachycardia at 110, tachypneic at 28 with O2 sats 93% on room air.  Covid positive.  Chest x-ray consistent with viral pneumonia.  Blood work showing mild hyponatremia of 129 with mild AKI, creatinine 1.26 with bicarb of 17. Lactic acid 1.5.  Patient was hydrated in the ER.     PT Comments    Pt was sitting EOB on 15 L HFNC upon arriving with sao2 97%. He agrees to PT session. Therapist decreased HFNC to 10 L HFNC and pt able to maintain >88% even while performing standing exercises. Attempted wean to 5 L HFNC while performing exercises however pt does de-sat to 82%. Return to 10 L HFNC and pt able to tolerate exercises. Seated rest between standing trials. Pt able to stand and perform alternating marching 30 x in place. No LOB or unsteadiness. He was in bed at conclusion of session.Was able to be weaned to 5 L HFNC after ~ 2 minutes in long sitting. Discussed with RN that pt is maintaining >92% on 5 HFNC but if she gets pt OOB may need to increased O2 supply. Pt is progressing well. Recommend DC to home with HHPT if pt continues to be able to wean O2 demands. Limited by respiratory restriction. Not strength, balance or  Fatigue.       Follow Up Recommendations  Supervision - Intermittent;Home health PT     Equipment Recommendations  Rolling walker with 5" wheels    Recommendations for Other Services       Precautions / Restrictions Precautions Precautions: Fall Restrictions Weight Bearing Restrictions: No     Mobility  Bed Mobility Overal bed mobility: Modified Independent Bed Mobility: Supine to Sit;Sit to Supine     Supine to sit: Modified independent (Device/Increase time);HOB elevated Sit to supine: Modified independent (Device/Increase time);HOB elevated      Transfers Overall transfer level: Needs assistance Equipment used: Rolling walker (2 wheeled) Transfers: Sit to/from Stand Sit to Stand: Supervision         General transfer comment: pt performed STS at EOB 5 x throughout session. performed standing marching each time alternating lifting LEs 30x each  Ambulation/Gait             General Gait Details: Will progress away from EOB once pt weaned off HFNC.        Cognition Arousal/Alertness: Awake/alert Behavior During Therapy: WFL for tasks assessed/performed Overall Cognitive Status: Within Functional Limits for tasks assessed          General Comments: Pt is A and O x 4. On 15 L HFNC upon arriving. Agreeable to PT session             Pertinent Vitals/Pain Pain Assessment: No/denies pain           PT Goals (current goals can now be found in the care plan section) Acute Rehab PT Goals Patient Stated Goal: go home Progress towards PT goals: Progressing toward goals    Frequency    Min 2X/week  PT Plan Discharge plan needs to be updated       AM-PAC PT "6 Clicks" Mobility   Outcome Measure  Help needed turning from your back to your side while in a flat bed without using bedrails?: None Help needed moving from lying on your back to sitting on the side of a flat bed without using bedrails?: None Help needed moving to and from a bed to a chair (including a wheelchair)?: A Little Help needed standing up from a chair using your arms (e.g., wheelchair or bedside chair)?: A Little Help needed to walk in hospital room?: A Little Help needed climbing 3-5 steps with a railing? : A Little 6 Click Score: 20    End of Session Equipment  Utilized During Treatment: Gait belt;Oxygen Activity Tolerance: Patient tolerated treatment well Patient left: in bed;with bed alarm set;with call bell/phone within reach Nurse Communication: Mobility status PT Visit Diagnosis: Muscle weakness (generalized) (M62.81);Difficulty in walking, not elsewhere classified (R26.2)     Time: 9476-5465 PT Time Calculation (min) (ACUTE ONLY): 26 min  Charges:  $Therapeutic Exercise: 8-22 mins $Therapeutic Activity: 8-22 mins                     Jetta Lout PTA 06/08/20, 2:58 PM

## 2020-06-08 NOTE — Progress Notes (Signed)
PROGRESS NOTE    Dwayne Dalton  TDH:[id number] DOB: 1966-05-22 DOA: 05/30/2020 PCP: Patient, No Pcp Per     Brief Narrative:  Dwayne Dalton is a 54 y.o. WM PMHx   Presents with a 1 week history of fever chills cough and shortness of breath as well as chest pain now with associated diarrhea.  Chest pain is worse on taking a deep breath.  Patient is unvaccinated against Covid.  ED Course: On arrival febrile at 102.7, tachycardia at 110, tachypneic at 28 with O2 sats 93% on room air.  Covid positive.  Chest x-ray consistent with viral pneumonia.  Blood work showing mild hyponatremia of 129 with mild AKI, creatinine 1.26 with bicarb of 17. Lactic acid 1.5.  Patient was hydrated in the ER.  Hospitalist consulted for admission.   Subjective: Afebrile overnight, A/O x4, answers all questions appropriately but able to tell patient is developmentally delayed.  Unsure of who is his HCPOA.  Positive S OB, negative CP, negative abdominal pain    Assessment & Plan: Covid vaccination; unvaccinated   Active Problems:   Pneumonia due to COVID-19 virus   Diarrhea due to COVID-19   AKI (acute kidney injury) (HCC)   Metabolic acidosis   Obesity, Class III, BMI 40-49.9 (morbid obesity) (HCC)   COVID-19  Acute respiratory failure with hypoxia/Covid pneumonia COVID-19 Labs  Recent Labs    06/06/20 1045 06/07/20 0618 06/08/20 0439  FERRITIN  --  1,055* 890*  LDH  --  706* 566*  CRP 1.4* 0.7  --     Lab Results  Component Value Date   SARSCOV2NAA POSITIVE (A) 05/30/2020   -8/24 Remdesivir per pharmacy protocol -8/24 Baricitinib x14-day course per pharmacy protocol.  Per EMR HCPOA okayed use of this experimental medicationThe treatment plan and use of medications and known side effects were discussed with patient/family. It was clearly explained that Complete risks and long-term side effects are unknown, however in the best clinical judgment they seem to be of some clinical benefit rather  than medical risks. Patient/family agree with the treatment plan and want to receive these treatments as indicated.  -Vitamin C and zinc per Covid protocol -Combivent QID  Chronic lower extremity edema -Did not appreciate on exam  Obesity, morbid (BMI 43.07 kg/m) -Placing the patient at high risk for poor outcomes with COVID-19  Diarrhea. -Florastor.  Add Imodium  Left lower extremity cellulitis. -Completed course of Keflex.  Developmentally delayed -Easy to reorient -Not sure patient fully understands his situation -Who is his HCPOA?   DVT prophylaxis: Lovenox Code Status: Full Family Communication:  Status is: Inpatient    Dispo: The patient is from: Home              Anticipated d/c is to: Home vs SNF              Anticipated d/c date is:??              Patient currently extremely unstable      Consultants:    Procedures/Significant Events:    I have personally reviewed and interpreted all radiology studies and my findings are as above.  VENTILATOR SETTINGS: HFNC 9/2 Flow 15 L/min SPO2 82%   Cultures   Antimicrobials: Anti-infectives (From admission, onward)   Start     Ordered Stop   06/03/20 0000  cephALEXin (KEFLEX) capsule 500 mg  Status:  Discontinued        06/02/20 2045 06/06/20 1939   06/01/20 1000  remdesivir 100 mg  in sodium chloride 0.9 % 100 mL IVPB       "Followed by" Linked Group Details   05/31/20 0144 06/04/20 0926   05/31/20 0145  remdesivir 200 mg in sodium chloride 0.9% 250 mL IVPB       "Followed by" Linked Group Details   05/31/20 0144 05/31/20 0616       Devices    LINES / TUBES:      Continuous Infusions:   Objective: Vitals:   06/07/20 1152 06/07/20 1633 06/07/20 1955 06/08/20 0710  BP: 133/80 (!) 139/91 122/78   Pulse: 86 70 76   Resp: 20 20 20    Temp: 97.6 F (36.4 C) (!) 97.3 F (36.3 C) 98.9 F (37.2 C)   TempSrc: Oral Axillary    SpO2: 95% 99% 99% 96%  Weight:      Height:         Intake/Output Summary (Last 24 hours) at 06/08/2020 0944 Last data filed at 06/07/2020 1500 Gross per 24 hour  Intake 240 ml  Output 625 ml  Net -385 ml   Filed Weights   05/30/20 2052  Weight: 124.7 kg    Examination:  General: A/O x4, positive acute respiratory distress Eyes: negative scleral hemorrhage, negative anisocoria, negative icterus ENT: Negative Runny nose, negative gingival bleeding, Neck:  Negative scars, masses, torticollis, lymphadenopathy, JVD Lungs: Clear to auscultation bilaterally without wheezes or crackles Cardiovascular: Regular rate and rhythm without murmur gallop or rub normal S1 and S2 Abdomen: negative abdominal pain, nondistended, positive soft, bowel sounds, no rebound, no ascites, no appreciable mass Extremities: No significant cyanosis, clubbing, or edema bilateral lower extremities Skin: Negative rashes, lesions, ulcers Psychiatric:  Negative depression, negative anxiety, negative fatigue, negative mania, not entirely sure patient fully understands the seriousness of his disease process. Central nervous system:  Cranial nerves II through XII intact, tongue/uvula midline, all extremities muscle strength 5/5, sensation intact throughout, negative dysarthria, negative expressive aphasia, negative receptive aphasia.  .     Data Reviewed: Care during the described time interval was provided by me .  I have reviewed this patient's available data, including medical history, events of note, physical examination, and all test results as part of my evaluation.  CBC: Recent Labs  Lab 06/04/20 0359 06/05/20 0642 06/06/20 1045 06/07/20 0618 06/08/20 0439  WBC 9.5 10.6* 14.1* 15.4* 14.5*  NEUTROABS 8.1* 9.1* 11.5* 12.9* 12.6*  HGB 14.5 14.8 15.4 14.9 15.5  HCT 41.5 41.4 41.5 41.2 42.0  MCV 85.2 84.8 81.7 84.8 81.9  PLT 273 335 458* 499* 520*   Basic Metabolic Panel: Recent Labs  Lab 06/04/20 0359 06/05/20 0642 06/06/20 1045 06/07/20 0618  06/08/20 0439  NA 137 138 136 136 137  K 2.9* 3.5 3.6 3.6 3.8  CL 103 104 102 100 100  CO2 28 20* 24 25 31   GLUCOSE 157* 149* 179* 166* 176*  BUN 21* 22* 21* 28* 29*  CREATININE 0.93 0.96 1.07 1.08 0.86  CALCIUM 8.0* 7.9* 8.3* 8.4* 8.4*  MG 2.5* 2.4 2.8* 2.8* 2.8*  PHOS  --   --   --  4.2 4.5   GFR: Estimated Creatinine Clearance: 127.2 mL/min (by C-G formula based on SCr of 0.86 mg/dL). Liver Function Tests: Recent Labs  Lab 06/04/20 0359 06/05/20 0642 06/06/20 1045 06/07/20 0618 06/08/20 0439  AST 57* 49* 38 34 31  ALT 85* 92* 80* 71* 69*  ALKPHOS 71 79 77 69 71  BILITOT 1.0 1.1 1.3* 1.4* 1.0  PROT 6.1* 6.3* 6.7  6.3* 6.3*  ALBUMIN 3.0* 3.1* 3.4* 3.3* 3.4*   No results for input(s): LIPASE, AMYLASE in the last 168 hours. No results for input(s): AMMONIA in the last 168 hours. Coagulation Profile: No results for input(s): INR, PROTIME in the last 168 hours. Cardiac Enzymes: No results for input(s): CKTOTAL, CKMB, CKMBINDEX, TROPONINI in the last 168 hours. BNP (last 3 results) No results for input(s): PROBNP in the last 8760 hours. HbA1C: No results for input(s): HGBA1C in the last 72 hours. CBG: No results for input(s): GLUCAP in the last 168 hours. Lipid Profile: No results for input(s): CHOL, HDL, LDLCALC, TRIG, CHOLHDL, LDLDIRECT in the last 72 hours. Thyroid Function Tests: No results for input(s): TSH, T4TOTAL, FREET4, T3FREE, THYROIDAB in the last 72 hours. Anemia Panel: Recent Labs    06/07/20 0618 06/08/20 0439  FERRITIN 1,055* 890*   Sepsis Labs: No results for input(s): PROCALCITON, LATICACIDVEN in the last 168 hours.  Recent Results (from the past 240 hour(s))  SARS Coronavirus 2 by RT PCR (hospital order, performed in [facility] hospital lab) Nasopharyngeal Nasopharyngeal Swab     Status: Abnormal   Collection Time: 05/30/20  9:11 PM   Specimen: Nasopharyngeal Swab  Result Value Ref Range Status   SARS Coronavirus 2 POSITIVE (A) NEGATIVE  Corrected    Comment: RESULT CALLED TO, READ BACK BY AND VERIFIED WITH: ASHLEY ORSUTO @2215  ON 05/27/20 SKL (NOTE) SARS-CoV-2 target nucleic acids are DETECTED  SARS-CoV-2 RNA is generally detectable in upper respiratory specimens  during the acute phase of infection.  Positive results are indicative  of the presence of the identified virus, but do not rule out bacterial infection or co-infection with other pathogens not detected by the test.  Clinical correlation with patient history and  other diagnostic information is necessary to determine patient infection status.  The expected result is negative.  Fact Sheet for Patients:   BoilerBrush.com.cyhttps://www.fda.gov/media/136312/download   Fact Sheet for Healthcare Providers:   https://pope.com/https://www.fda.gov/media/136313/download    This test is not yet approved or cleared by the [place] FDA and  has been authorized for detection and/or diagnosis of SARS-CoV-2 by FDA under an Emergency Use Authorization (EUA).  This EUA will remain in effect (meaning this te st can be used) for the duration of  the COVID-19 declaration under Section 564(b)(1) of the Act, 21 U.S.C. section 360-bbb-3(b)(1), unless the authorization is terminated or revoked sooner.  Performed at [facility], [address] [place], [place] [address] CORRECTED ON 08/24 AT 2222: PREVIOUSLY REPORTED AS POSITIVE RESULT CALLED TO, READ BACK BY AND VERIFIED WITH: ASHLEY ORSUTO [id number] ON 05/27/20 SKL   Culture, blood (routine x 2)     Status: None   Collection Time: 05/30/20 11:36 PM   Specimen: BLOOD  Result Value Ref Range Status   Specimen Description BLOOD LAC  Final   Special Requests BOTTLES DRAWN AEROBIC AND ANAEROBIC BCAV  Final   Culture   Final    NO GROWTH 5 DAYS Performed at [facility], [address] [place], [place] [address]    Report Status 06/05/2020 FINAL  Final  Culture, blood (routine x 2)     Status: None   Collection Time: 05/30/20 11:36 PM    Specimen: BLOOD  Result Value Ref Range Status   Specimen Description BLOOD RAC  Final   Special Requests BOTTLES DRAWN AEROBIC AND ANAEROBIC BCAV  Final   Culture   Final    NO GROWTH 5 DAYS Performed at [facility],  [address] [place], [place] [address]    Report Status 06/05/2020 FINAL  Final         Radiology Studies: No results found.      Scheduled Meds: . vitamin C  500 mg Oral Daily  . baricitinib  4 mg Oral Daily  . enoxaparin (LOVENOX) injection  60 mg Subcutaneous Q24H  . furosemide  20 mg Intravenous BID  . Ipratropium-Albuterol  1 puff Inhalation QID  . methylPREDNISolone (SOLU-MEDROL) injection  60 mg Intravenous TID  . saccharomyces boulardii  250 mg Oral BID  . zinc sulfate  220 mg Oral Daily   Continuous Infusions:   LOS: 8 days    Time spent:40 min    Tevin Shillingford, Roselind Messier, MD [facility] Pager [phone]  If 7PM-7AM, please contact night-coverage [email or web address] Password [facility] 06/08/2020, 9:44 AM

## 2020-06-09 LAB — CBC WITH DIFFERENTIAL/PLATELET
Abs Immature Granulocytes: 0.43 10*3/uL — ABNORMAL HIGH (ref 0.00–0.07)
Basophils Absolute: 0.1 10*3/uL (ref 0.0–0.1)
Basophils Relative: 0 %
Eosinophils Absolute: 0 10*3/uL (ref 0.0–0.5)
Eosinophils Relative: 0 %
HCT: 43.6 % (ref 39.0–52.0)
Hemoglobin: 15.7 g/dL (ref 13.0–17.0)
Immature Granulocytes: 3 %
Lymphocytes Relative: 4 %
Lymphs Abs: 0.6 10*3/uL — ABNORMAL LOW (ref 0.7–4.0)
MCH: 30.3 pg (ref 26.0–34.0)
MCHC: 36 g/dL (ref 30.0–36.0)
MCV: 84.2 fL (ref 80.0–100.0)
Monocytes Absolute: 0.5 10*3/uL (ref 0.1–1.0)
Monocytes Relative: 3 %
Neutro Abs: 13.9 10*3/uL — ABNORMAL HIGH (ref 1.7–7.7)
Neutrophils Relative %: 90 %
Platelets: 526 10*3/uL — ABNORMAL HIGH (ref 150–400)
RBC: 5.18 MIL/uL (ref 4.22–5.81)
RDW: 13.6 % (ref 11.5–15.5)
WBC: 15.4 10*3/uL — ABNORMAL HIGH (ref 4.0–10.5)
nRBC: 0.1 % (ref 0.0–0.2)

## 2020-06-09 LAB — COMPREHENSIVE METABOLIC PANEL
ALT: 63 U/L — ABNORMAL HIGH (ref 0–44)
AST: 30 U/L (ref 15–41)
Albumin: 3.5 g/dL (ref 3.5–5.0)
Alkaline Phosphatase: 72 U/L (ref 38–126)
Anion gap: 12 (ref 5–15)
BUN: 31 mg/dL — ABNORMAL HIGH (ref 6–20)
CO2: 25 mmol/L (ref 22–32)
Calcium: 8.5 mg/dL — ABNORMAL LOW (ref 8.9–10.3)
Chloride: 97 mmol/L — ABNORMAL LOW (ref 98–111)
Creatinine, Ser: 1.07 mg/dL (ref 0.61–1.24)
GFR calc Af Amer: >60 mL/min (ref >60)
GFR calc non Af Amer: >60 mL/min (ref >60)
Glucose, Bld: 187 mg/dL — ABNORMAL HIGH (ref 70–99)
Potassium: 3.8 mmol/L (ref 3.5–5.1)
Sodium: 134 mmol/L — ABNORMAL LOW (ref 135–145)
Total Bilirubin: 1.2 mg/dL (ref 0.3–1.2)
Total Protein: 6.4 g/dL — ABNORMAL LOW (ref 6.5–8.1)

## 2020-06-09 LAB — FIBRIN DERIVATIVES D-DIMER (ARMC ONLY): Fibrin derivatives D-dimer (ARMC): 362.2 ng/mL (FEU) (ref 0.00–499.00)

## 2020-06-09 LAB — C-REACTIVE PROTEIN: CRP: <0.5 mg/dL (ref <1.0)

## 2020-06-09 LAB — LACTATE DEHYDROGENASE: LDH: 521 U/L — ABNORMAL HIGH (ref 98–192)

## 2020-06-09 LAB — PHOSPHORUS: Phosphorus: 4.1 mg/dL (ref 2.5–4.6)

## 2020-06-09 LAB — MAGNESIUM: Magnesium: 3.2 mg/dL — ABNORMAL HIGH (ref 1.7–2.4)

## 2020-06-09 LAB — FERRITIN: Ferritin: 1123 ng/mL — ABNORMAL HIGH (ref 24–336)

## 2020-06-09 MED ORDER — NAPHAZOLINE-GLYCERIN 0.012-0.2 % OP SOLN
1.0000 [drp] | Freq: Four times a day (QID) | OPHTHALMIC | Status: DC | PRN
  Administered 2020-06-09 – 2020-06-15 (×4): 2 [drp] via OPHTHALMIC
  Filled 2020-06-09: qty 15

## 2020-06-09 MED ORDER — SALINE SPRAY 0.65 % NA SOLN
1.0000 | NASAL | Status: DC | PRN
  Administered 2020-06-10 – 2020-06-16 (×10): 1 via NASAL
  Filled 2020-06-09: qty 44

## 2020-06-09 NOTE — Progress Notes (Signed)
Pt AAox4,Pt transfers with standby assist independently. Up to chair today Pt on 5L HFNC titrated up based on activity level.  Pt maintaining sats at rest to 95%. Pt desats to 70s when oxygen removed.Utilizes IBD, able to achieve 1750. He does not c/o SOB, he is talkative and appears comfortable, no use of accessory muscles ;Pt with no complaints today. No adverse events or acute changes. Pt has good appetite. Father updated on Pt status. Will continue to monitor

## 2020-06-09 NOTE — Progress Notes (Addendum)
PROGRESS NOTE    Bjorn PippinJeffrey Guthrie  VWU:[id number]:[id number] DOB: 12/06/1965 DOA: 05/30/2020 PCP: Patient, No Pcp Per     Brief Narrative:  Bjorn PippinJeffrey Conard is a 54 y.o. WM PMHx   Presents with a 1 week history of fever chills cough and shortness of breath as well as chest pain now with associated diarrhea.  Chest pain is worse on taking a deep breath.  Patient is unvaccinated against Covid.  ED Course: On arrival febrile at 102.7, tachycardia at 110, tachypneic at 28 with O2 sats 93% on room air.  Covid positive.  Chest x-ray consistent with viral pneumonia.  Blood work showing mild hyponatremia of 129 with mild AKI, creatinine 1.26 with bicarb of 17. Lactic acid 1.5.  Patient was hydrated in the ER.  Hospitalist consulted for admission.   Subjective: 9/3 A/O x4, concerned that his nose continues to be dry, and also having difficulty with dry eyes.  Requests eyedrops and nasal spray.   Assessment & Plan: Covid vaccination; unvaccinated   Active Problems:   Pneumonia due to COVID-19 virus   Diarrhea due to COVID-19   AKI (acute kidney injury) (HCC)   Metabolic acidosis   Obesity, Class III, BMI 40-49.9 (morbid obesity) (HCC)   COVID-19  Acute respiratory failure with hypoxia/Covid pneumonia COVID-19 Labs  Recent Labs    06/07/20 0618 06/08/20 0439 06/09/20 0707  FERRITIN 1,055* 890* 1,123*  LDH 706* 566* 521*  CRP 0.7 0.6 <0.5    Lab Results  Component Value Date   SARSCOV2NAA POSITIVE (A) 05/30/2020   -8/24 Remdesivir per pharmacy protocol -8/24 Baricitinib x14-day course per pharmacy protocol.  Per EMR HCPOA okayed use of this experimental medicationThe treatment plan and use of medications and known side effects were discussed with patient/family. It was clearly explained that Complete risks and long-term side effects are unknown, however in the best clinical judgment they seem to be of some clinical benefit rather than medical risks. Patient/family agree with the treatment plan  and want to receive these treatments as indicated.  -Vitamin C and zinc per Covid protocol -Combivent QID  Chronic lower extremity edema -Did not appreciate on exam  Obesity, morbid (BMI 43.07 kg/m) -Placing the patient at high risk for poor outcomes with COVID-19  Diarrhea. -Florastor.  Add Imodium  Left lower extremity cellulitis. -Completed course of Keflex.  Developmentally delayed -Easy to reorient -Not sure patient fully understands his situation -Who is his HCPOA?   DVT prophylaxis: Lovenox Code Status: Full Family Communication:  Status is: Inpatient    Dispo: The patient is from: Home              Anticipated d/c is to: Home vs SNF              Anticipated d/c date is:??              Patient currently extremely unstable      Consultants:    Procedures/Significant Events:    I have personally reviewed and interpreted all radiology studies and my findings are as above.  VENTILATOR SETTINGS: HFNC 9/3 Flow 7 L/min SPO2 93%   Cultures   Antimicrobials: Anti-infectives (From admission, onward)   Start     Ordered Stop   06/03/20 0000  cephALEXin (KEFLEX) capsule 500 mg  Status:  Discontinued        06/02/20 2045 06/06/20 1939   06/01/20 1000  remdesivir 100 mg in sodium chloride 0.9 % 100 mL IVPB       "Followed  by" Linked Group Details   05/31/20 0144 06/04/20 0926   05/31/20 0145  remdesivir 200 mg in sodium chloride 0.9% 250 mL IVPB       "Followed by" Linked Group Details   05/31/20 0144 05/31/20 0616       Devices    LINES / TUBES:      Continuous Infusions:   Objective: Vitals:   06/09/20 0431 06/09/20 0721 06/09/20 1153 06/09/20 1627  BP: 128/82 140/82 (!) 147/89 (!) 135/93  Pulse: 80 87 87 74  Resp: (!) 24 (!) 24 18 14   Temp: 97.8 F (36.6 C) 98.4 F (36.9 C) 97.9 F (36.6 C) 97.7 F (36.5 C)  TempSrc: Oral Oral Oral Oral  SpO2: (!) 88% (!) 86% 93% 94%  Weight:      Height:        Intake/Output Summary  (Last 24 hours) at 06/09/2020 1657 Last data filed at 06/09/2020 1200 Gross per 24 hour  Intake 480 ml  Output 1300 ml  Net -820 ml   Filed Weights   05/30/20 2052  Weight: 124.7 kg    Examination:  General: A/O x4, positive acute respiratory distress Eyes: negative scleral hemorrhage, negative anisocoria, negative icterus ENT: Negative Runny nose, negative gingival bleeding, Neck:  Negative scars, masses, torticollis, lymphadenopathy, JVD Lungs: Clear to auscultation bilaterally without wheezes or crackles Cardiovascular: Regular rate and rhythm without murmur gallop or rub normal S1 and S2 Abdomen: negative abdominal pain, nondistended, positive soft, bowel sounds, no rebound, no ascites, no appreciable mass Extremities: No significant cyanosis, clubbing, or edema bilateral lower extremities Skin: Negative rashes, lesions, ulcers Psychiatric:  Negative depression, negative anxiety, negative fatigue, negative mania, not entirely sure patient fully understands the seriousness of his disease process. Central nervous system:  Cranial nerves II through XII intact, tongue/uvula midline, all extremities muscle strength 5/5, sensation intact throughout, negative dysarthria, negative expressive aphasia, negative receptive aphasia.  .     Data Reviewed: Care during the described time interval was provided by me .  I have reviewed this patient's available data, including medical history, events of note, physical examination, and all test results as part of my evaluation.  CBC: Recent Labs  Lab 06/05/20 0642 06/06/20 1045 06/07/20 0618 06/08/20 0439 06/09/20 0707  WBC 10.6* 14.1* 15.4* 14.5* 15.4*  NEUTROABS 9.1* 11.5* 12.9* 12.6* 13.9*  HGB 14.8 15.4 14.9 15.5 15.7  HCT 41.4 41.5 41.2 42.0 43.6  MCV 84.8 81.7 84.8 81.9 84.2  PLT 335 458* 499* 520* 526*   Basic Metabolic Panel: Recent Labs  Lab 06/05/20 0642 06/06/20 1045 06/07/20 0618 06/08/20 0439 06/09/20 0707  NA 138 136  136 137 134*  K 3.5 3.6 3.6 3.8 3.8  CL 104 102 100 100 97*  CO2 20* 24 25 31 25   GLUCOSE 149* 179* 166* 176* 187*  BUN 22* 21* 28* 29* 31*  CREATININE 0.96 1.07 1.08 0.86 1.07  CALCIUM 7.9* 8.3* 8.4* 8.4* 8.5*  MG 2.4 2.8* 2.8* 2.8* 3.2*  PHOS  --   --  4.2 4.5 4.1   GFR: Estimated Creatinine Clearance: 102.2 mL/min (by C-G formula based on SCr of 1.07 mg/dL). Liver Function Tests: Recent Labs  Lab 06/05/20 0642 06/06/20 1045 06/07/20 0618 06/08/20 0439 06/09/20 0707  AST 49* 38 34 31 30  ALT 92* 80* 71* 69* 63*  ALKPHOS 79 77 69 71 72  BILITOT 1.1 1.3* 1.4* 1.0 1.2  PROT 6.3* 6.7 6.3* 6.3* 6.4*  ALBUMIN 3.1* 3.4* 3.3* 3.4* 3.5  No results for input(s): LIPASE, AMYLASE in the last 168 hours. No results for input(s): AMMONIA in the last 168 hours. Coagulation Profile: No results for input(s): INR, PROTIME in the last 168 hours. Cardiac Enzymes: No results for input(s): CKTOTAL, CKMB, CKMBINDEX, TROPONINI in the last 168 hours. BNP (last 3 results) No results for input(s): PROBNP in the last 8760 hours. HbA1C: No results for input(s): HGBA1C in the last 72 hours. CBG: No results for input(s): GLUCAP in the last 168 hours. Lipid Profile: No results for input(s): CHOL, HDL, LDLCALC, TRIG, CHOLHDL, LDLDIRECT in the last 72 hours. Thyroid Function Tests: No results for input(s): TSH, T4TOTAL, FREET4, T3FREE, THYROIDAB in the last 72 hours. Anemia Panel: Recent Labs    06/08/20 0439 06/09/20 0707  FERRITIN 890* 1,123*   Sepsis Labs: No results for input(s): PROCALCITON, LATICACIDVEN in the last 168 hours.  Recent Results (from the past 240 hour(s))  SARS Coronavirus 2 by RT PCR (hospital order, performed in [facility] hospital lab) Nasopharyngeal Nasopharyngeal Swab     Status: Abnormal   Collection Time: 05/30/20  9:11 PM   Specimen: Nasopharyngeal Swab  Result Value Ref Range Status   SARS Coronavirus 2 POSITIVE (A) NEGATIVE Corrected    Comment: RESULT  CALLED TO, READ BACK BY AND VERIFIED WITH: ASHLEY ORSUTO @2215  ON 05/27/20 SKL (NOTE) SARS-CoV-2 target nucleic acids are DETECTED  SARS-CoV-2 RNA is generally detectable in upper respiratory specimens  during the acute phase of infection.  Positive results are indicative  of the presence of the identified virus, but do not rule out bacterial infection or co-infection with other pathogens not detected by the test.  Clinical correlation with patient history and  other diagnostic information is necessary to determine patient infection status.  The expected result is negative.  Fact Sheet for Patients:   05/29/20   Fact Sheet for Healthcare Providers:   BoilerBrush.com.cy    This test is not yet approved or cleared by the https://pope.com/ FDA and  has been authorized for detection and/or diagnosis of SARS-CoV-2 by FDA under an Emergency Use Authorization (EUA).  This EUA will remain in effect (meaning this te st can be used) for the duration of  the COVID-19 declaration under Section 564(b)(1) of the Act, 21 U.S.C. section 360-bbb-3(b)(1), unless the authorization is terminated or revoked sooner.  Performed at [facility], [address] [place], [place] CORRECTED ON 08/24 AT 2222: PREVIOUSLY REPORTED AS POSITIVE RESULT CALLED TO, READ BACK BY AND VERIFIED WITH: ASHLEY ORSUTO [id number] ON 05/27/20 SKL   Culture, blood (routine x 2)     Status: None   Collection Time: 05/30/20 11:36 PM   Specimen: BLOOD  Result Value Ref Range Status   Specimen Description BLOOD LAC  Final   Special Requests BOTTLES DRAWN AEROBIC AND ANAEROBIC BCAV  Final   Culture   Final    NO GROWTH 5 DAYS Performed at [facility], [address], [place], [place]    Report Status 06/05/2020 FINAL  Final  Culture, blood (routine x 2)     Status: None   Collection Time: 05/30/20 11:36 PM   Specimen: BLOOD  Result Value  Ref Range Status   Specimen Description BLOOD RAC  Final   Special Requests BOTTLES DRAWN AEROBIC AND ANAEROBIC BCAV  Final   Culture   Final    NO GROWTH 5 DAYS Performed at [facility], [address], [place], [place]    Report  Status 06/05/2020 FINAL  Final         Radiology Studies: No results found.      Scheduled Meds: . vitamin C  500 mg Oral Daily  . baricitinib  4 mg Oral Daily  . enoxaparin (LOVENOX) injection  60 mg Subcutaneous Q24H  . furosemide  20 mg Intravenous BID  . Ipratropium-Albuterol  1 puff Inhalation QID  . methylPREDNISolone (SOLU-MEDROL) injection  60 mg Intravenous TID  . saccharomyces boulardii  250 mg Oral BID  . zinc sulfate  220 mg Oral Daily   Continuous Infusions:   LOS: 9 days    Time spent:40 min    Jackey Housey, Roselind Messier, MD [facility] Pager [phone]  If 7PM-7AM, please contact night-coverage [email or web address] Password [facility] 06/09/2020, 4:57 PM

## 2020-06-10 LAB — COMPREHENSIVE METABOLIC PANEL
ALT: 69 U/L — ABNORMAL HIGH (ref 0–44)
AST: 28 U/L (ref 15–41)
Albumin: 3.5 g/dL (ref 3.5–5.0)
Alkaline Phosphatase: 70 U/L (ref 38–126)
Anion gap: 11 (ref 5–15)
BUN: 32 mg/dL — ABNORMAL HIGH (ref 6–20)
CO2: 27 mmol/L (ref 22–32)
Calcium: 8.4 mg/dL — ABNORMAL LOW (ref 8.9–10.3)
Chloride: 96 mmol/L — ABNORMAL LOW (ref 98–111)
Creatinine, Ser: 1.18 mg/dL (ref 0.61–1.24)
GFR calc Af Amer: >60 mL/min (ref >60)
GFR calc non Af Amer: >60 mL/min (ref >60)
Glucose, Bld: 175 mg/dL — ABNORMAL HIGH (ref 70–99)
Potassium: 4 mmol/L (ref 3.5–5.1)
Sodium: 134 mmol/L — ABNORMAL LOW (ref 135–145)
Total Bilirubin: 1.1 mg/dL (ref 0.3–1.2)
Total Protein: 6.5 g/dL (ref 6.5–8.1)

## 2020-06-10 LAB — CBC WITH DIFFERENTIAL/PLATELET
Abs Immature Granulocytes: 0.33 10*3/uL — ABNORMAL HIGH (ref 0.00–0.07)
Basophils Absolute: 0 10*3/uL (ref 0.0–0.1)
Basophils Relative: 0 %
Eosinophils Absolute: 0 10*3/uL (ref 0.0–0.5)
Eosinophils Relative: 0 %
HCT: 43.8 % (ref 39.0–52.0)
Hemoglobin: 15.5 g/dL (ref 13.0–17.0)
Immature Granulocytes: 2 %
Lymphocytes Relative: 3 %
Lymphs Abs: 0.4 10*3/uL — ABNORMAL LOW (ref 0.7–4.0)
MCH: 30.1 pg (ref 26.0–34.0)
MCHC: 35.4 g/dL (ref 30.0–36.0)
MCV: 85 fL (ref 80.0–100.0)
Monocytes Absolute: 0.4 10*3/uL (ref 0.1–1.0)
Monocytes Relative: 3 %
Neutro Abs: 13.5 10*3/uL — ABNORMAL HIGH (ref 1.7–7.7)
Neutrophils Relative %: 92 %
Platelets: 465 10*3/uL — ABNORMAL HIGH (ref 150–400)
RBC: 5.15 MIL/uL (ref 4.22–5.81)
RDW: 13.2 % (ref 11.5–15.5)
WBC: 14.7 10*3/uL — ABNORMAL HIGH (ref 4.0–10.5)
nRBC: 0 % (ref 0.0–0.2)

## 2020-06-10 LAB — FIBRIN DERIVATIVES D-DIMER (ARMC ONLY): Fibrin derivatives D-dimer (ARMC): 416.06 ng/mL (FEU) (ref 0.00–499.00)

## 2020-06-10 LAB — C-REACTIVE PROTEIN: CRP: <0.5 mg/dL (ref <1.0)

## 2020-06-10 LAB — LACTATE DEHYDROGENASE: LDH: 463 U/L — ABNORMAL HIGH (ref 98–192)

## 2020-06-10 LAB — PHOSPHORUS: Phosphorus: 3.6 mg/dL (ref 2.5–4.6)

## 2020-06-10 LAB — FERRITIN: Ferritin: 1311 ng/mL — ABNORMAL HIGH (ref 24–336)

## 2020-06-10 LAB — MAGNESIUM: Magnesium: 3 mg/dL — ABNORMAL HIGH (ref 1.7–2.4)

## 2020-06-10 NOTE — Progress Notes (Signed)
PROGRESS NOTE    Dwayne Dalton  HBZ:[id number] DOB: [birth year] DOA: 05/30/2020 PCP: Patient, No Pcp Per     Brief Narrative:  Dwayne Dalton is a 54 y.o. WM PMHx   Presents with a 1 week history of fever chills cough and shortness of breath as well as chest pain now with associated diarrhea.  Chest pain is worse on taking a deep breath.  Patient is unvaccinated against Covid.  ED Course: On arrival febrile at 102.7, tachycardia at 110, tachypneic at 28 with O2 sats 93% on room air.  Covid positive.  Chest x-ray consistent with viral pneumonia.  Blood work showing mild hyponatremia of 129 with mild AKI, creatinine 1.26 with bicarb of 17. Lactic acid 1.5.  Patient was hydrated in the ER.  Hospitalist consulted for admission.   Subjective: 9/4 afebrile overnight A/O x4, negative abdominal pain, positive S OB, but improved   Assessment & Plan: Covid vaccination; unvaccinated   Active Problems:   Pneumonia due to COVID-19 virus   Diarrhea due to COVID-19   AKI (acute kidney injury) (HCC)   Metabolic acidosis   Obesity, Class III, BMI 40-49.9 (morbid obesity) (HCC)   COVID-19  Acute respiratory failure with hypoxia/Covid pneumonia COVID-19 Labs  Recent Labs    06/08/20 0439 06/09/20 0707 06/10/20 0741  FERRITIN 890* 1,123* 1,311*  LDH 566* 521* 463*  CRP 0.6 <0.5  --     Lab Results  Component Value Date   SARSCOV2NAA POSITIVE (A) 05/30/2020   -8/24 Remdesivir per pharmacy protocol -8/24 Baricitinib x14-day course per pharmacy protocol.  Per EMR HCPOA okayed use of this experimental medicationThe treatment plan and use of medications and known side effects were discussed with patient/family. It was clearly explained that Complete risks and long-term side effects are unknown, however in the best clinical judgment they seem to be of some clinical benefit rather than medical risks. Patient/family agree with the treatment plan and want to receive these treatments as indicated.   -Vitamin C and zinc per Covid protocol -Combivent QID  Chronic lower extremity edema -Did not appreciate on exam  Obesity, morbid (BMI 43.07 kg/m) -Placing the patient at high risk for poor outcomes with COVID-19  Diarrhea. -Florastor.  Add Imodium  Left lower extremity cellulitis. -Completed course of Keflex.  Developmentally delayed -Easy to reorient -Not sure patient fully understands his situation -Who is his HCPOA?  Goals of care -PT/OT recommend home health  DVT prophylaxis: Lovenox Code Status: Full Family Communication:  Status is: Inpatient    Dispo: The patient is from: Home              Anticipated d/c is to: Home vs SNF              Anticipated d/c date is:??              Patient currently extremely unstable      Consultants:    Procedures/Significant Events:    I have personally reviewed and interpreted all radiology studies and my findings are as above.  VENTILATOR SETTINGS: HFNC 9/5 Flow 8 L/min SPO2 83%  Cultures   Antimicrobials: Anti-infectives (From admission, onward)   Start     Ordered Stop   06/03/20 0000  cephALEXin (KEFLEX) capsule 500 mg  Status:  Discontinued        06/02/20 2045 06/06/20 1939   06/01/20 1000  remdesivir 100 mg in sodium chloride 0.9 % 100 mL IVPB       "Followed by" Linked Group  Details   05/31/20 0144 06/04/20 0926   05/31/20 0145  remdesivir 200 mg in sodium chloride 0.9% 250 mL IVPB       "Followed by" Linked Group Details   05/31/20 0144 05/31/20 0616       Devices    LINES / TUBES:      Continuous Infusions:   Objective: Vitals:   06/10/20 0016 06/10/20 0033 06/10/20 0039 06/10/20 0343  BP: 129/85   136/75  Pulse: 76   88  Resp: 19   16  Temp: 97.7 F (36.5 C)   (!) 97.5 F (36.4 C)  TempSrc: Oral   Oral  SpO2: 92% (!) 83% 91% 91%  Weight:      Height:        Intake/Output Summary (Last 24 hours) at 06/10/2020 0759 Last data filed at 06/09/2020 1837 Gross per 24 hour   Intake 720 ml  Output 1300 ml  Net -580 ml   Filed Weights   05/30/20 2052  Weight: 124.7 kg   Physical Exam:  General: A/O x4, positive acute respiratory distress Eyes: negative scleral hemorrhage, negative anisocoria, negative icterus ENT: Negative Runny nose, negative gingival bleeding, Neck:  Negative scars, masses, torticollis, lymphadenopathy, JVD Lungs: Clear to auscultation bilaterally without wheezes or crackles Cardiovascular: Regular rate and rhythm without murmur gallop or rub normal S1 and S2 Abdomen: negative abdominal pain, nondistended, positive soft, bowel sounds, no rebound, no ascites, no appreciable mass Extremities: No significant cyanosis, clubbing, or edema bilateral lower extremities Skin: Negative rashes, lesions, ulcers Psychiatric:  Negative depression, negative anxiety, negative fatigue, negative mania, mild developmental delay Central nervous system:  Cranial nerves II through XII intact, tongue/uvula midline, all extremities muscle strength 5/5, sensation intact throughout,negative dysarthria, negative expressive aphasia, negative receptive aphasia.   .     Data Reviewed: Care during the described time interval was provided by me .  I have reviewed this patient's available data, including medical history, events of note, physical examination, and all test results as part of my evaluation.  CBC: Recent Labs  Lab 06/05/20 0642 06/06/20 1045 06/07/20 0618 06/08/20 0439 06/09/20 0707  WBC 10.6* 14.1* 15.4* 14.5* 15.4*  NEUTROABS 9.1* 11.5* 12.9* 12.6* 13.9*  HGB 14.8 15.4 14.9 15.5 15.7  HCT 41.4 41.5 41.2 42.0 43.6  MCV 84.8 81.7 84.8 81.9 84.2  PLT 335 458* 499* 520* 526*   Basic Metabolic Panel: Recent Labs  Lab 06/05/20 0642 06/06/20 1045 06/07/20 0618 06/08/20 0439 06/09/20 0707  NA 138 136 136 137 134*  K 3.5 3.6 3.6 3.8 3.8  CL 104 102 100 100 97*  CO2 20* 24 25 31 25   GLUCOSE 149* 179* 166* 176* 187*  BUN 22* 21* 28* 29* 31*   CREATININE 0.96 1.07 1.08 0.86 1.07  CALCIUM 7.9* 8.3* 8.4* 8.4* 8.5*  MG 2.4 2.8* 2.8* 2.8* 3.2*  PHOS  --   --  4.2 4.5 4.1   GFR: Estimated Creatinine Clearance: 102.2 mL/min (by C-G formula based on SCr of 1.07 mg/dL). Liver Function Tests: Recent Labs  Lab 06/05/20 06/07/20 06/06/20 1045 06/07/20 0618 06/08/20 0439 06/09/20 0707  AST 49* 38 34 31 30  ALT 92* 80* 71* 69* 63*  ALKPHOS 79 77 69 71 72  BILITOT 1.1 1.3* 1.4* 1.0 1.2  PROT 6.3* 6.7 6.3* 6.3* 6.4*  ALBUMIN 3.1* 3.4* 3.3* 3.4* 3.5   No results for input(s): LIPASE, AMYLASE in the last 168 hours. No results for input(s): AMMONIA in the last 168 hours.  Coagulation Profile: No results for input(s): INR, PROTIME in the last 168 hours. Cardiac Enzymes: No results for input(s): CKTOTAL, CKMB, CKMBINDEX, TROPONINI in the last 168 hours. BNP (last 3 results) No results for input(s): PROBNP in the last 8760 hours. HbA1C: No results for input(s): HGBA1C in the last 72 hours. CBG: No results for input(s): GLUCAP in the last 168 hours. Lipid Profile: No results for input(s): CHOL, HDL, LDLCALC, TRIG, CHOLHDL, LDLDIRECT in the last 72 hours. Thyroid Function Tests: No results for input(s): TSH, T4TOTAL, FREET4, T3FREE, THYROIDAB in the last 72 hours. Anemia Panel: Recent Labs    06/08/20 0439 06/09/20 0707  FERRITIN 890* 1,123*   Sepsis Labs: No results for input(s): PROCALCITON, LATICACIDVEN in the last 168 hours.  No results found for this or any previous visit (from the past 240 hour(s)).       Radiology Studies: No results found.      Scheduled Meds: . vitamin C  500 mg Oral Daily  . baricitinib  4 mg Oral Daily  . enoxaparin (LOVENOX) injection  60 mg Subcutaneous Q24H  . furosemide  20 mg Intravenous BID  . Ipratropium-Albuterol  1 puff Inhalation QID  . methylPREDNISolone (SOLU-MEDROL) injection  60 mg Intravenous TID  . saccharomyces boulardii  250 mg Oral BID  . zinc sulfate  220 mg Oral  Daily   Continuous Infusions:   LOS: 10 days    Time spent:40 min    Anthoni Geerts, Roselind Messier, MD [facility] Pager [phone]  If 7PM-7AM, please contact night-coverage [email or web address] Password [email or web address] 06/10/2020, 7:59 AM

## 2020-06-11 LAB — COMPREHENSIVE METABOLIC PANEL
ALT: 78 U/L — ABNORMAL HIGH (ref 0–44)
AST: 32 U/L (ref 15–41)
Albumin: 3.5 g/dL (ref 3.5–5.0)
Alkaline Phosphatase: 69 U/L (ref 38–126)
Anion gap: 13 (ref 5–15)
BUN: 32 mg/dL — ABNORMAL HIGH (ref 6–20)
CO2: 26 mmol/L (ref 22–32)
Calcium: 8.7 mg/dL — ABNORMAL LOW (ref 8.9–10.3)
Chloride: 95 mmol/L — ABNORMAL LOW (ref 98–111)
Creatinine, Ser: 1.2 mg/dL (ref 0.61–1.24)
GFR calc Af Amer: >60 mL/min (ref >60)
GFR calc non Af Amer: >60 mL/min (ref >60)
Glucose, Bld: 171 mg/dL — ABNORMAL HIGH (ref 70–99)
Potassium: 4.2 mmol/L (ref 3.5–5.1)
Sodium: 134 mmol/L — ABNORMAL LOW (ref 135–145)
Total Bilirubin: 1 mg/dL (ref 0.3–1.2)
Total Protein: 6.5 g/dL (ref 6.5–8.1)

## 2020-06-11 LAB — CBC WITH DIFFERENTIAL/PLATELET
Abs Immature Granulocytes: 0.28 10*3/uL — ABNORMAL HIGH (ref 0.00–0.07)
Basophils Absolute: 0 10*3/uL (ref 0.0–0.1)
Basophils Relative: 0 %
Eosinophils Absolute: 0 10*3/uL (ref 0.0–0.5)
Eosinophils Relative: 0 %
HCT: 43.5 % (ref 39.0–52.0)
Hemoglobin: 15.8 g/dL (ref 13.0–17.0)
Immature Granulocytes: 2 %
Lymphocytes Relative: 3 %
Lymphs Abs: 0.4 10*3/uL — ABNORMAL LOW (ref 0.7–4.0)
MCH: 30.2 pg (ref 26.0–34.0)
MCHC: 36.3 g/dL — ABNORMAL HIGH (ref 30.0–36.0)
MCV: 83 fL (ref 80.0–100.0)
Monocytes Absolute: 0.5 10*3/uL (ref 0.1–1.0)
Monocytes Relative: 3 %
Neutro Abs: 16.3 10*3/uL — ABNORMAL HIGH (ref 1.7–7.7)
Neutrophils Relative %: 92 %
Platelets: 484 10*3/uL — ABNORMAL HIGH (ref 150–400)
RBC: 5.24 MIL/uL (ref 4.22–5.81)
RDW: 13.3 % (ref 11.5–15.5)
WBC: 17.6 10*3/uL — ABNORMAL HIGH (ref 4.0–10.5)
nRBC: 0.1 % (ref 0.0–0.2)

## 2020-06-11 LAB — FERRITIN: Ferritin: 1403 ng/mL — ABNORMAL HIGH (ref 24–336)

## 2020-06-11 LAB — FIBRIN DERIVATIVES D-DIMER (ARMC ONLY): Fibrin derivatives D-dimer (ARMC): 329.56 ng/mL (FEU) (ref 0.00–499.00)

## 2020-06-11 LAB — MAGNESIUM: Magnesium: 2.8 mg/dL — ABNORMAL HIGH (ref 1.7–2.4)

## 2020-06-11 LAB — LACTATE DEHYDROGENASE: LDH: 449 U/L — ABNORMAL HIGH (ref 98–192)

## 2020-06-11 LAB — C-REACTIVE PROTEIN: CRP: <0.5 mg/dL (ref <1.0)

## 2020-06-11 LAB — PHOSPHORUS: Phosphorus: 3.9 mg/dL (ref 2.5–4.6)

## 2020-06-11 NOTE — Progress Notes (Signed)
PROGRESS NOTE    Dwayne Dalton  NWG:[id number] DOB: [birth year] DOA: 05/30/2020 PCP: Patient, No Pcp Per     Brief Narrative:  Dwayne Dalton is a 54 y.o. WM PMHx   Presents with a 1 week history of fever chills cough and shortness of breath as well as chest pain now with associated diarrhea.  Chest pain is worse on taking a deep breath.  Patient is unvaccinated against Covid.  ED Course: On arrival febrile at 102.7, tachycardia at 110, tachypneic at 28 with O2 sats 93% on room air.  Covid positive.  Chest x-ray consistent with viral pneumonia.  Blood work showing mild hyponatremia of 129 with mild AKI, creatinine 1.26 with bicarb of 17. Lactic acid 1.5.  Patient was hydrated in the ER.  Hospitalist consulted for admission.   Subjective: 9/5 afebrile overnight A/O x4, negative abdominal pain, positive S OB.  Patient attempted to remove his O2 to show that he was ready to be discharged patient's SPO2 dropped into the 70s.  Patient took some time to recover.   Assessment & Plan: Covid vaccination; unvaccinated   Active Problems:   Pneumonia due to COVID-19 virus   Diarrhea due to COVID-19   AKI (acute kidney injury) (HCC)   Metabolic acidosis   Obesity, Class III, BMI 40-49.9 (morbid obesity) (HCC)   COVID-19  Acute respiratory failure with hypoxia/Covid pneumonia COVID-19 Labs  Recent Labs    06/09/20 0707 06/10/20 0741 06/11/20 0509  FERRITIN 1,123* 1,311* 1,403*  LDH 521* 463* 449*  CRP <0.5 <0.5 <0.5    Lab Results  Component Value Date   SARSCOV2NAA POSITIVE (A) 05/30/2020   -8/24 Remdesivir per pharmacy protocol -8/24 Baricitinib x14-day course per pharmacy protocol.  Per EMR HCPOA okayed use of this experimental medicationThe treatment plan and use of medications and known side effects were discussed with patient/family. It was clearly explained that Complete risks and long-term side effects are unknown, however in the best clinical judgment they seem to be of some  clinical benefit rather than medical risks. Patient/family agree with the treatment plan and want to receive these treatments as indicated.  -Vitamin C and zinc per Covid protocol -Combivent QID -Flutter valve -Incentive spirometry -Patient counseled at length that currently his O2 requirements are too high to go without oxygen and that he is going to pass out if he attempts to remove his O2 to try to prove that he is ready for discharge.  Chronic lower extremity edema -Did not appreciate on exam  Obesity, morbid (BMI 43.07 kg/m) -Placing the patient at high risk for poor outcomes with COVID-19  Diarrhea. -Florastor.  Add Imodium  Left lower extremity cellulitis. -Completed course of Keflex.  Developmentally delayed -Easy to reorient -Not sure patient fully understands his situation -Who is his HCPOA?  Goals of care -PT/OT recommend home health   DVT prophylaxis: Lovenox Code Status: Full Family Communication:  Status is: Inpatient    Dispo: The patient is from: Home              Anticipated d/c is to: Home health              Anticipated d/c date is:??              Patient currently extremely unstable      Consultants:    Procedures/Significant Events:    I have personally reviewed and interpreted all radiology studies and my findings are as above.  VENTILATOR SETTINGS: HFNC 9/5 Flow 8 L/min SPO2  83%  Cultures   Antimicrobials: Anti-infectives (From admission, onward)   Start     Ordered Stop   06/03/20 0000  cephALEXin (KEFLEX) capsule 500 mg  Status:  Discontinued        06/02/20 2045 06/06/20 1939   06/01/20 1000  remdesivir 100 mg in sodium chloride 0.9 % 100 mL IVPB       "Followed by" Linked Group Details   05/31/20 0144 06/04/20 0926   05/31/20 0145  remdesivir 200 mg in sodium chloride 0.9% 250 mL IVPB       "Followed by" Linked Group Details   05/31/20 0144 05/31/20 0616       Devices    LINES / TUBES:      Continuous  Infusions:   Objective: Vitals:   06/11/20 0419 06/11/20 0745 06/11/20 1115 06/11/20 1630  BP: 140/82 138/72  137/76  Pulse: 82   75  Resp: 18 19  18   Temp: 97.7 F (36.5 C) 98.1 F (36.7 C)  97.9 F (36.6 C)  TempSrc: Oral Oral  Oral  SpO2: 97% 90% 90% 98%  Weight:      Height:        Intake/Output Summary (Last 24 hours) at 06/11/2020 1814 Last data filed at 06/11/2020 1252 Gross per 24 hour  Intake --  Output 1150 ml  Net -1150 ml   Filed Weights   05/30/20 2052  Weight: 124.7 kg   Physical Exam:  General: A/O x4, positive acute respiratory distress Eyes: negative scleral hemorrhage, negative anisocoria, negative icterus ENT: Negative Runny nose, negative gingival bleeding, Neck:  Negative scars, masses, torticollis, lymphadenopathy, JVD Lungs: Clear to auscultation bilaterally without wheezes or crackles Cardiovascular: Regular rate and rhythm without murmur gallop or rub normal S1 and S2 Abdomen: negative abdominal pain, nondistended, positive soft, bowel sounds, no rebound, no ascites, no appreciable mass Extremities: No significant cyanosis, clubbing, or edema bilateral lower extremities Skin: Negative rashes, lesions, ulcers Psychiatric:  Negative depression, negative anxiety, negative fatigue, negative mania, mild developmental delay Central nervous system:  Cranial nerves II through XII intact, tongue/uvula midline, all extremities muscle strength 5/5, sensation intact throughout,negative dysarthria, negative expressive aphasia, negative receptive aphasia.   .     Data Reviewed: Care during the described time interval was provided by me .  I have reviewed this patient's available data, including medical history, events of note, physical examination, and all test results as part of my evaluation.  CBC: Recent Labs  Lab 06/07/20 0618 06/08/20 0439 06/09/20 0707 06/10/20 0741 06/11/20 0509  WBC 15.4* 14.5* 15.4* 14.7* 17.6*  NEUTROABS 12.9* 12.6* 13.9*  13.5* 16.3*  HGB 14.9 15.5 15.7 15.5 15.8  HCT 41.2 42.0 43.6 43.8 43.5  MCV 84.8 81.9 84.2 85.0 83.0  PLT 499* 520* 526* 465* 484*   Basic Metabolic Panel: Recent Labs  Lab 06/07/20 0618 06/08/20 0439 06/09/20 0707 06/10/20 0741 06/11/20 0509  NA 136 137 134* 134* 134*  K 3.6 3.8 3.8 4.0 4.2  CL 100 100 97* 96* 95*  CO2 25 31 25 27 26   GLUCOSE 166* 176* 187* 175* 171*  BUN 28* 29* 31* 32* 32*  CREATININE 1.08 0.86 1.07 1.18 1.20  CALCIUM 8.4* 8.4* 8.5* 8.4* 8.7*  MG 2.8* 2.8* 3.2* 3.0* 2.8*  PHOS 4.2 4.5 4.1 3.6 3.9   GFR: Estimated Creatinine Clearance: 91.2 mL/min (by C-G formula based on SCr of 1.2 mg/dL). Liver Function Tests: Recent Labs  Lab 06/07/20 0618 06/08/20 0439 06/09/20 0707 06/10/20 0741 06/11/20  0509  AST 34 31 30 28  32  ALT 71* 69* 63* 69* 78*  ALKPHOS 69 71 72 70 69  BILITOT 1.4* 1.0 1.2 1.1 1.0  PROT 6.3* 6.3* 6.4* 6.5 6.5  ALBUMIN 3.3* 3.4* 3.5 3.5 3.5   No results for input(s): LIPASE, AMYLASE in the last 168 hours. No results for input(s): AMMONIA in the last 168 hours. Coagulation Profile: No results for input(s): INR, PROTIME in the last 168 hours. Cardiac Enzymes: No results for input(s): CKTOTAL, CKMB, CKMBINDEX, TROPONINI in the last 168 hours. BNP (last 3 results) No results for input(s): PROBNP in the last 8760 hours. HbA1C: No results for input(s): HGBA1C in the last 72 hours. CBG: No results for input(s): GLUCAP in the last 168 hours. Lipid Profile: No results for input(s): CHOL, HDL, LDLCALC, TRIG, CHOLHDL, LDLDIRECT in the last 72 hours. Thyroid Function Tests: No results for input(s): TSH, T4TOTAL, FREET4, T3FREE, THYROIDAB in the last 72 hours. Anemia Panel: Recent Labs    06/10/20 0741 06/11/20 0509  FERRITIN 1,311* 1,403*   Sepsis Labs: No results for input(s): PROCALCITON, LATICACIDVEN in the last 168 hours.  No results found for this or any previous visit (from the past 240 hour(s)).       Radiology  Studies: No results found.      Scheduled Meds: . vitamin C  500 mg Oral Daily  . baricitinib  4 mg Oral Daily  . enoxaparin (LOVENOX) injection  60 mg Subcutaneous Q24H  . furosemide  20 mg Intravenous BID  . Ipratropium-Albuterol  1 puff Inhalation QID  . methylPREDNISolone (SOLU-MEDROL) injection  60 mg Intravenous TID  . saccharomyces boulardii  250 mg Oral BID  . zinc sulfate  220 mg Oral Daily   Continuous Infusions:   LOS: 11 days    Time spent:40 min    Dwayne Dalton, 08/11/20, MD [facility] Pager [phone]  If 7PM-7AM, please contact night-coverage [email or web address] Password [facility] 06/11/2020, 6:14 PM

## 2020-06-12 LAB — CBC WITH DIFFERENTIAL/PLATELET
Abs Immature Granulocytes: 0.22 10*3/uL — ABNORMAL HIGH (ref 0.00–0.07)
Basophils Absolute: 0 10*3/uL (ref 0.0–0.1)
Basophils Relative: 0 %
Eosinophils Absolute: 0 10*3/uL (ref 0.0–0.5)
Eosinophils Relative: 0 %
HCT: 41.6 % (ref 39.0–52.0)
Hemoglobin: 15.4 g/dL (ref 13.0–17.0)
Immature Granulocytes: 2 %
Lymphocytes Relative: 3 %
Lymphs Abs: 0.4 10*3/uL — ABNORMAL LOW (ref 0.7–4.0)
MCH: 30.6 pg (ref 26.0–34.0)
MCHC: 37 g/dL — ABNORMAL HIGH (ref 30.0–36.0)
MCV: 82.5 fL (ref 80.0–100.0)
Monocytes Absolute: 0.3 10*3/uL (ref 0.1–1.0)
Monocytes Relative: 2 %
Neutro Abs: 13.4 10*3/uL — ABNORMAL HIGH (ref 1.7–7.7)
Neutrophils Relative %: 93 %
Platelets: 406 10*3/uL — ABNORMAL HIGH (ref 150–400)
RBC: 5.04 MIL/uL (ref 4.22–5.81)
RDW: 13.3 % (ref 11.5–15.5)
WBC: 14.3 10*3/uL — ABNORMAL HIGH (ref 4.0–10.5)
nRBC: 0 % (ref 0.0–0.2)

## 2020-06-12 LAB — COMPREHENSIVE METABOLIC PANEL
ALT: 83 U/L — ABNORMAL HIGH (ref 0–44)
AST: 30 U/L (ref 15–41)
Albumin: 3.4 g/dL — ABNORMAL LOW (ref 3.5–5.0)
Alkaline Phosphatase: 66 U/L (ref 38–126)
Anion gap: 11 (ref 5–15)
BUN: 29 mg/dL — ABNORMAL HIGH (ref 6–20)
CO2: 31 mmol/L (ref 22–32)
Calcium: 8.5 mg/dL — ABNORMAL LOW (ref 8.9–10.3)
Chloride: 90 mmol/L — ABNORMAL LOW (ref 98–111)
Creatinine, Ser: 0.97 mg/dL (ref 0.61–1.24)
GFR calc Af Amer: >60 mL/min (ref >60)
GFR calc non Af Amer: >60 mL/min (ref >60)
Glucose, Bld: 196 mg/dL — ABNORMAL HIGH (ref 70–99)
Potassium: 4 mmol/L (ref 3.5–5.1)
Sodium: 132 mmol/L — ABNORMAL LOW (ref 135–145)
Total Bilirubin: 1 mg/dL (ref 0.3–1.2)
Total Protein: 6.4 g/dL — ABNORMAL LOW (ref 6.5–8.1)

## 2020-06-12 LAB — C-REACTIVE PROTEIN: CRP: 0.7 mg/dL (ref <1.0)

## 2020-06-12 LAB — MAGNESIUM: Magnesium: 2.6 mg/dL — ABNORMAL HIGH (ref 1.7–2.4)

## 2020-06-12 LAB — PHOSPHORUS: Phosphorus: 4 mg/dL (ref 2.5–4.6)

## 2020-06-12 LAB — FERRITIN: Ferritin: 1314 ng/mL — ABNORMAL HIGH (ref 24–336)

## 2020-06-12 LAB — FIBRIN DERIVATIVES D-DIMER (ARMC ONLY): Fibrin derivatives D-dimer (ARMC): 375.79 ng/mL (FEU) (ref 0.00–499.00)

## 2020-06-12 LAB — LACTATE DEHYDROGENASE: LDH: 393 U/L — ABNORMAL HIGH (ref 98–192)

## 2020-06-12 NOTE — Progress Notes (Signed)
Pt. Found on room air and refuses to wear high flow at this time.sat between 81 to 84 on room air.. Pt. Made aware of the risk of not wearing high flow nasal cannula.

## 2020-06-12 NOTE — Progress Notes (Signed)
PROGRESS NOTE    Dwayne Dalton  FGH:[id number] DOB: May 29, 1966 DOA: 05/30/2020 PCP: Patient, No Pcp Per     Brief Narrative:  Dwayne Dalton is a 54 y.o. WM PMHx   Presents with a 1 week history of fever chills cough and shortness of breath as well as chest pain now with associated diarrhea.  Chest pain is worse on taking a deep breath.  Patient is unvaccinated against Covid.  ED Course: On arrival febrile at 102.7, tachycardia at 110, tachypneic at 28 with O2 sats 93% on room air.  Covid positive.  Chest x-ray consistent with viral pneumonia.  Blood work showing mild hyponatremia of 129 with mild AKI, creatinine 1.26 with bicarb of 17. Lactic acid 1.5.  Patient was hydrated in the ER.  Hospitalist consulted for admission.   Subjective: 8/6 afebrile overnight A/O x4, negative abdominal pain, positive S OB.  Patient extremely upset because states with his O2 on and monitors on unable to get a urinal in time and has urinated on himself several times, which is why he keeps pulling off his O2.  Has agreed to try a condom cath.   Assessment & Plan: Covid vaccination; unvaccinated   Active Problems:   Pneumonia due to COVID-19 virus   Diarrhea due to COVID-19   AKI (acute kidney injury) (HCC)   Metabolic acidosis   Obesity, Class III, BMI 40-49.9 (morbid obesity) (HCC)   COVID-19  Acute respiratory failure with hypoxia/Covid pneumonia COVID-19 Labs  Recent Labs    06/10/20 0741 06/11/20 0509 06/12/20 0618  FERRITIN 1,311* 1,403* 1,314*  LDH 463* 449* 393*  CRP <0.5 <0.5  --     Lab Results  Component Value Date   SARSCOV2NAA POSITIVE (A) 05/30/2020   -8/24 Remdesivir per pharmacy protocol -8/24 Baricitinib x14-day course per pharmacy protocol.  Per EMR HCPOA okayed use of this experimental medicationThe treatment plan and use of medications and known side effects were discussed with patient/family. It was clearly explained that Complete risks and long-term side effects are  unknown, however in the best clinical judgment they seem to be of some clinical benefit rather than medical risks. Patient/family agree with the treatment plan and want to receive these treatments as indicated.  -Vitamin C and zinc per Covid protocol -Combivent QID -Flutter valve -Incentive spirometry -Patient counseled at length that currently his O2 requirements are too high to go without oxygen and that he is going to pass out if he attempts to remove his O2 to try to prove that he is ready for discharge.  Chronic lower extremity edema -Did not appreciate on exam  Obesity, morbid (BMI 43.07 kg/m) -Placing the patient at high risk for poor outcomes with COVID-19  Diarrhea. -Florastor.  Add Imodium  Left lower extremity cellulitis. -Completed course of Keflex.  Developmentally delayed -Easy to reorient -Not sure patient fully understands his situation -Who is his HCPOA?  Goals of care -PT/OT recommend home health   DVT prophylaxis: Lovenox Code Status: Full Family Communication:  Status is: Inpatient    Dispo: The patient is from: Home              Anticipated d/c is to: Home health              Anticipated d/c date is:??              Patient currently extremely unstable      Consultants:    Procedures/Significant Events:    I have personally reviewed and interpreted all  radiology studies and my findings are as above.  VENTILATOR SETTINGS: HFNC 9/6 Flow 8 L/min SPO2 83%  Cultures   Antimicrobials: Anti-infectives (From admission, onward)   Start     Ordered Stop   06/03/20 0000  cephALEXin (KEFLEX) capsule 500 mg  Status:  Discontinued        06/02/20 2045 06/06/20 1939   06/01/20 1000  remdesivir 100 mg in sodium chloride 0.9 % 100 mL IVPB       "Followed by" Linked Group Details   05/31/20 0144 06/04/20 0926   05/31/20 0145  remdesivir 200 mg in sodium chloride 0.9% 250 mL IVPB       "Followed by" Linked Group Details   05/31/20 0144  05/31/20 0616       Devices    LINES / TUBES:      Continuous Infusions:   Objective: Vitals:   06/11/20 2031 06/11/20 2334 06/12/20 0625 06/12/20 0744  BP: 131/87 (!) 139/92 (!) 145/92 135/90  Pulse: 79 75 77 77  Resp: 16 18 16 17   Temp: 97.7 F (36.5 C) 97.6 F (36.4 C) (!) 97.5 F (36.4 C) 98 F (36.7 C)  TempSrc: Oral Oral Oral Oral  SpO2: 95% 95% 96% 94%  Weight:      Height:        Intake/Output Summary (Last 24 hours) at 06/12/2020 1010 Last data filed at 06/11/2020 1950 Gross per 24 hour  Intake --  Output 1450 ml  Net -1450 ml   Filed Weights   05/30/20 2052  Weight: 124.7 kg   Physical Exam:  General: A/O x4, positive acute respiratory distress Eyes: negative scleral hemorrhage, negative anisocoria, negative icterus ENT: Negative Runny nose, negative gingival bleeding, Neck:  Negative scars, masses, torticollis, lymphadenopathy, JVD Lungs: Clear to auscultation bilaterally without wheezes or crackles Cardiovascular: Regular rate and rhythm without murmur gallop or rub normal S1 and S2 Abdomen: negative abdominal pain, nondistended, positive soft, bowel sounds, no rebound, no ascites, no appreciable mass Extremities: No significant cyanosis, clubbing, or edema bilateral lower extremities Skin: Negative rashes, lesions, ulcers Psychiatric:  Negative depression, negative anxiety, negative fatigue, negative mania, mild developmental delay Central nervous system:  Cranial nerves II through XII intact, tongue/uvula midline, all extremities muscle strength 5/5, sensation intact throughout,negative dysarthria, negative expressive aphasia, negative receptive aphasia.   .     Data Reviewed: Care during the described time interval was provided by me .  I have reviewed this patient's available data, including medical history, events of note, physical examination, and all test results as part of my evaluation.  CBC: Recent Labs  Lab 06/08/20 0439  06/09/20 0707 06/10/20 0741 06/11/20 0509 06/12/20 0618  WBC 14.5* 15.4* 14.7* 17.6* 14.3*  NEUTROABS 12.6* 13.9* 13.5* 16.3* 13.4*  HGB 15.5 15.7 15.5 15.8 15.4  HCT 42.0 43.6 43.8 43.5 41.6  MCV 81.9 84.2 85.0 83.0 82.5  PLT 520* 526* 465* 484* 406*   Basic Metabolic Panel: Recent Labs  Lab 06/08/20 0439 06/09/20 0707 06/10/20 0741 06/11/20 0509 06/12/20 0618  NA 137 134* 134* 134* 132*  K 3.8 3.8 4.0 4.2 4.0  CL 100 97* 96* 95* 90*  CO2 31 25 27 26 31   GLUCOSE 176* 187* 175* 171* 196*  BUN 29* 31* 32* 32* 29*  CREATININE 0.86 1.07 1.18 1.20 0.97  CALCIUM 8.4* 8.5* 8.4* 8.7* 8.5*  MG 2.8* 3.2* 3.0* 2.8* 2.6*  PHOS 4.5 4.1 3.6 3.9 4.0   GFR: Estimated Creatinine Clearance: 112.8 mL/min (by C-G  formula based on SCr of 0.97 mg/dL). Liver Function Tests: Recent Labs  Lab 06/08/20 0439 06/09/20 0707 06/10/20 0741 06/11/20 0509 06/12/20 0618  AST 31 30 28  32 30  ALT 69* 63* 69* 78* 83*  ALKPHOS 71 72 70 69 66  BILITOT 1.0 1.2 1.1 1.0 1.0  PROT 6.3* 6.4* 6.5 6.5 6.4*  ALBUMIN 3.4* 3.5 3.5 3.5 3.4*   No results for input(s): LIPASE, AMYLASE in the last 168 hours. No results for input(s): AMMONIA in the last 168 hours. Coagulation Profile: No results for input(s): INR, PROTIME in the last 168 hours. Cardiac Enzymes: No results for input(s): CKTOTAL, CKMB, CKMBINDEX, TROPONINI in the last 168 hours. BNP (last 3 results) No results for input(s): PROBNP in the last 8760 hours. HbA1C: No results for input(s): HGBA1C in the last 72 hours. CBG: No results for input(s): GLUCAP in the last 168 hours. Lipid Profile: No results for input(s): CHOL, HDL, LDLCALC, TRIG, CHOLHDL, LDLDIRECT in the last 72 hours. Thyroid Function Tests: No results for input(s): TSH, T4TOTAL, FREET4, T3FREE, THYROIDAB in the last 72 hours. Anemia Panel: Recent Labs    06/11/20 0509 06/12/20 0618  FERRITIN 1,403* 1,314*   Sepsis Labs: No results for input(s): PROCALCITON, LATICACIDVEN  in the last 168 hours.  No results found for this or any previous visit (from the past 240 hour(s)).       Radiology Studies: No results found.      Scheduled Meds: . vitamin C  500 mg Oral Daily  . baricitinib  4 mg Oral Daily  . enoxaparin (LOVENOX) injection  60 mg Subcutaneous Q24H  . furosemide  20 mg Intravenous BID  . Ipratropium-Albuterol  1 puff Inhalation QID  . methylPREDNISolone (SOLU-MEDROL) injection  60 mg Intravenous TID  . saccharomyces boulardii  250 mg Oral BID  . zinc sulfate  220 mg Oral Daily   Continuous Infusions:   LOS: 12 days    Time spent:40 min    Nerissa Constantin, 08/12/20, MD [facility] Pager [phone]  If 7PM-7AM, please contact night-coverage [email or web address] Password [email or web address] 06/12/2020, 10:10 AM

## 2020-06-12 NOTE — Progress Notes (Signed)
Patient removed oxygen several times during the night and noted to dest in the low 80's. Patient frustrated with having to wear oxygen via [place]. Patient stated, "I tired of this fu----ing sh--. It makes too much noise and I cant sleep". RN educated patient that the oxygen was necessary to help patient breath better and improve his condition. Patient agreed to lave the oxygen on. Patient safety maintained. Bed alarm on and functioning with difficulties noted. Call bell and working phone kept within reach. Will endorse.

## 2020-06-12 NOTE — Progress Notes (Signed)
Physical Therapy Treatment Patient Details Name: Dwayne Dalton MRN: [id number] DOB: [birth year] Today's Date: 06/12/2020    History of Present Illness 54 y.o. male with no significant medical history who presents with fever, chills, cough and shortness of breath as well as chest pain now with associated diarrhea.  Chest pain is worse on taking a deep breath.  Patient is unvaccinated against Covid. On arrival febrile at 102.7, tachycardia at 110, tachypneic at 28 with O2 sats 93% on room air.  Covid positive.  Chest x-ray consistent with viral pneumonia.     PT Comments    Pt was able to ambulate multiple small loops in the room but did need regular cuing to insure focused/appropriate breathing.  Pt on 10L t/o the session with sats dropping to mid 80s during activity and sustained talking, able to maintain <90% while focused on breath/not talking.  Pt did not have any LOBs, but was safer and more confident with walker and less so with single HHA and no UE use at all.  T/o session pt c/o throat feeling scratchy and having to cough up phlegm (very little actual coughing during session).  Pt continues to require significant amount of O2 but showed relatively good safety, balance, tolerance with modest ambulation and exercises.     Follow Up Recommendations  Supervision - Intermittent;Home health PT     Equipment Recommendations  Rolling walker with 5" wheels    Recommendations for Other Services       Precautions / Restrictions Precautions Precautions: Fall Restrictions Weight Bearing Restrictions: No    Mobility  Bed Mobility Overal bed mobility: Modified Independent Bed Mobility: Supine to Sit;Sit to Supine     Supine to sit: Modified independent (Device/Increase time);HOB elevated Sit to supine: Modified independent (Device/Increase time);HOB elevated      Transfers Overall transfer level: Needs assistance Equipment used: Rolling walker (2 wheeled) Transfers: Sit to/from  Stand Sit to Stand: From elevated surface;Supervision         General transfer comment: multiple sit to stand efforts t/o session with and w/o AD  Ambulation/Gait Ambulation/Gait assistance: Supervision Gait Distance (Feet): 80 Feet Assistive device: Rolling walker (2 wheeled);1 person hand held assist;None       General Gait Details: Pt was able to do multiple in-room-loops with occasional standing rest breaks.  First loop with walker, second with HHA and final loop with decreased UE support and finally no UEs.  He had some fatigue with sats generally 85-93% (on 10L) t/o the prolonged standing/walking effort.  He was generally able to keep sats above 90s when focusing on breathing and consistently dropped below 90 when talking during activity (consistent cuing to insure he did not talk too much to let sats drop more significantly)   Stairs             Wheelchair Mobility    Modified Rankin (Stroke Patients Only)       Balance Overall balance assessment: Modified Independent (pt certainly more confident with some UE use but no LOBs w/o)                                          Cognition Arousal/Alertness: Awake/alert Behavior During Therapy: WFL for tasks assessed/performed Overall Cognitive Status: Within Functional Limits for tasks assessed  Exercises General Exercises - Lower Extremity Long Arc Quad: AROM;Strengthening;10 reps Heel Slides: Strengthening;10 reps;AROM Hip Flexion/Marching: AROM;5 reps    General Comments        Pertinent Vitals/Pain Pain Assessment:  (c/o throat congestion/scratching t/o session)    Home Living                      Prior Function            PT Goals (current goals can now be found in the care plan section) Progress towards PT goals: Progressing toward goals (pt still requiringing 10L O2 to maintain >90% sats)    Frequency    Min  2X/week      PT Plan Discharge plan needs to be updated    Co-evaluation              AM-PAC PT "6 Clicks" Mobility   Outcome Measure  Help needed turning from your back to your side while in a flat bed without using bedrails?: None Help needed moving from lying on your back to sitting on the side of a flat bed without using bedrails?: None Help needed moving to and from a bed to a chair (including a wheelchair)?: A Little Help needed standing up from a chair using your arms (e.g., wheelchair or bedside chair)?: A Little Help needed to walk in hospital room?: A Little Help needed climbing 3-5 steps with a railing? : A Little 6 Click Score: 20    End of Session Equipment Utilized During Treatment: Gait belt;Oxygen Activity Tolerance: Patient tolerated treatment well;Patient limited by fatigue Patient left: in bed;with call bell/phone within reach Nurse Communication: Mobility status (O2 status) PT Visit Diagnosis: Muscle weakness (generalized) (M62.81);Difficulty in walking, not elsewhere classified (R26.2)     Time: 4696-2952 PT Time Calculation (min) (ACUTE ONLY): 26 min  Charges:  $Gait Training: 8-22 mins $Therapeutic Activity: 8-22 mins                     Malachi Pro, DPT 06/12/2020, 2:50 PM

## 2020-06-12 NOTE — Progress Notes (Signed)
Patient has done well with wearing nasal cannula today. In attempt to wean patient, oxygen reduced to 8L O2 and O2 saturation maintained at 92%. Patient's saturations tend to drop when he talks.

## 2020-06-12 NOTE — Progress Notes (Signed)
Father given nightly update

## 2020-06-13 LAB — CBC WITH DIFFERENTIAL/PLATELET
Abs Immature Granulocytes: 0.2 10*3/uL — ABNORMAL HIGH (ref 0.00–0.07)
Basophils Absolute: 0 10*3/uL (ref 0.0–0.1)
Basophils Relative: 0 %
Eosinophils Absolute: 0 10*3/uL (ref 0.0–0.5)
Eosinophils Relative: 0 %
HCT: 45.5 % (ref 39.0–52.0)
Hemoglobin: 15.8 g/dL (ref 13.0–17.0)
Immature Granulocytes: 1 %
Lymphocytes Relative: 4 %
Lymphs Abs: 0.6 10*3/uL — ABNORMAL LOW (ref 0.7–4.0)
MCH: 29.8 pg (ref 26.0–34.0)
MCHC: 34.7 g/dL (ref 30.0–36.0)
MCV: 85.7 fL (ref 80.0–100.0)
Monocytes Absolute: 0.5 10*3/uL (ref 0.1–1.0)
Monocytes Relative: 3 %
Neutro Abs: 13.7 10*3/uL — ABNORMAL HIGH (ref 1.7–7.7)
Neutrophils Relative %: 92 %
Platelets: 455 10*3/uL — ABNORMAL HIGH (ref 150–400)
RBC: 5.31 MIL/uL (ref 4.22–5.81)
RDW: 13.2 % (ref 11.5–15.5)
WBC: 15 10*3/uL — ABNORMAL HIGH (ref 4.0–10.5)
nRBC: 0 % (ref 0.0–0.2)

## 2020-06-13 LAB — COMPREHENSIVE METABOLIC PANEL
ALT: 91 U/L — ABNORMAL HIGH (ref 0–44)
AST: 32 U/L (ref 15–41)
Albumin: 3.6 g/dL (ref 3.5–5.0)
Alkaline Phosphatase: 67 U/L (ref 38–126)
Anion gap: 14 (ref 5–15)
BUN: 31 mg/dL — ABNORMAL HIGH (ref 6–20)
CO2: 29 mmol/L (ref 22–32)
Calcium: 8.9 mg/dL (ref 8.9–10.3)
Chloride: 88 mmol/L — ABNORMAL LOW (ref 98–111)
Creatinine, Ser: 1.21 mg/dL (ref 0.61–1.24)
GFR calc Af Amer: >60 mL/min (ref >60)
GFR calc non Af Amer: >60 mL/min (ref >60)
Glucose, Bld: 194 mg/dL — ABNORMAL HIGH (ref 70–99)
Potassium: 4.3 mmol/L (ref 3.5–5.1)
Sodium: 131 mmol/L — ABNORMAL LOW (ref 135–145)
Total Bilirubin: 1.1 mg/dL (ref 0.3–1.2)
Total Protein: 6.9 g/dL (ref 6.5–8.1)

## 2020-06-13 LAB — LACTATE DEHYDROGENASE: LDH: 388 U/L — ABNORMAL HIGH (ref 98–192)

## 2020-06-13 LAB — FERRITIN: Ferritin: 1546 ng/mL — ABNORMAL HIGH (ref 24–336)

## 2020-06-13 LAB — C-REACTIVE PROTEIN: CRP: <0.5 mg/dL (ref <1.0)

## 2020-06-13 LAB — MAGNESIUM: Magnesium: 2.6 mg/dL — ABNORMAL HIGH (ref 1.7–2.4)

## 2020-06-13 LAB — FIBRIN DERIVATIVES D-DIMER (ARMC ONLY): Fibrin derivatives D-dimer (ARMC): 374.1 ng/mL (FEU) (ref 0.00–499.00)

## 2020-06-13 LAB — PHOSPHORUS: Phosphorus: 4.3 mg/dL (ref 2.5–4.6)

## 2020-06-13 MED ORDER — ENOXAPARIN SODIUM 40 MG/0.4ML ~~LOC~~ SOLN
40.0000 mg | Freq: Two times a day (BID) | SUBCUTANEOUS | Status: DC
  Administered 2020-06-13 – 2020-06-19 (×12): 40 mg via SUBCUTANEOUS
  Filled 2020-06-13 (×12): qty 0.4

## 2020-06-13 NOTE — Progress Notes (Signed)
PT Cancellation Note  Patient Details Name: Dwayne Dalton MRN: [id number] DOB: [birth year]   Cancelled Treatment:    Reason Eval/Treat Not Completed: Other (comment) While working with another pt on the wing this PT saw [name] walking in the hallway with nursing staff.  Later spoke with staff who states that he did do relatively well, but that he was anxious and worked up today and suggested that it was probably better to not try and do PT with him today.  Will maintain on PT caseload and continue to see as appropriate.  Malachi Pro, DPT 06/13/2020, 3:32 PM

## 2020-06-13 NOTE — Progress Notes (Signed)
PROGRESS NOTE    Dwayne Dalton  WIO:[id number] DOB: August 11, 1966 DOA: 05/30/2020 PCP: Patient, No Pcp Per     Brief Narrative:  Dwayne Dalton is a 54 y.o. WM PMHx   Presents with a 1 week history of fever chills cough and shortness of breath as well as chest pain now with associated diarrhea.  Chest pain is worse on taking a deep breath.  Patient is unvaccinated against Covid.  ED Course: On arrival febrile at 102.7, tachycardia at 110, tachypneic at 28 with O2 sats 93% on room air.  Covid positive.  Chest x-ray consistent with viral pneumonia.  Blood work showing mild hyponatremia of 129 with mild AKI, creatinine 1.26 with bicarb of 17. Lactic acid 1.5.  Patient was hydrated in the ER.  Hospitalist consulted for admission.   Subjective: 9/7 afebrile overnight A/O x4, negative abdominal pain, positive S OB.  Patient doing well on room air during exam will obtain ambulatory SPO2 on patient   Assessment & Plan: Covid vaccination; unvaccinated   Active Problems:   Pneumonia due to COVID-19 virus   Diarrhea due to COVID-19   AKI (acute kidney injury) (HCC)   Metabolic acidosis   Obesity, Class III, BMI 40-49.9 (morbid obesity) (HCC)   COVID-19  Acute respiratory failure with hypoxia/Covid pneumonia COVID-19 Labs  Recent Labs    06/11/20 0509 06/12/20 0618 06/13/20 0602  FERRITIN 1,403* 1,314* 1,546*  LDH 449* 393* 388*  CRP <0.5 0.7  --     Lab Results  Component Value Date   SARSCOV2NAA POSITIVE (A) 05/30/2020   -8/24 Remdesivir per pharmacy protocol -8/24 Baricitinib x14-day course per pharmacy protocol.  Per EMR HCPOA okayed use of this experimental medicationThe treatment plan and use of medications and known side effects were discussed with patient/family. It was clearly explained that Complete risks and long-term side effects are unknown, however in the best clinical judgment they seem to be of some clinical benefit rather than medical risks. Patient/family agree with  the treatment plan and want to receive these treatments as indicated.  -Vitamin C and zinc per Covid protocol -Combivent QID -Flutter valve -Incentive spirometry -Patient counseled at length that currently his O2 requirements are too high to go without oxygen and that he is going to pass out if he attempts to remove his O2 to try to prove that he is ready for discharge. SATURATION QUALIFICATIONS: (This note is used to comply with regulatory documentation for home oxygen) Patient Saturations on Room Air at Rest = 93% Patient Saturations on Room Air while Ambulating = 80% Patient Saturations on 8 Liters of oxygen while Ambulating = 91% Please briefly explain why patient needs home oxygen: -Patient meets criteria for home O2 -Patient still requiring too much O2 to safely discharge (8 L)  Chronic lower extremity edema -Did not appreciate on exam  Obesity, morbid (BMI 43.07 kg/m) -Placing the patient at high risk for poor outcomes with COVID-19  Diarrhea. -Florastor.  Add Imodium  Left lower extremity cellulitis. -Completed course of Keflex.  Developmentally delayed -Easy to reorient -Not sure patient fully understands his situation -Who is his HCPOA?  Goals of care -PT/OT recommend home health -9/8 NCM consider LTAC given that patient's O2 demands when ambulating are too high to safely discharged home   DVT prophylaxis: Lovenox Code Status: Full Family Communication:  Status is: Inpatient    Dispo: The patient is from: Home              Anticipated d/c is to:  Home health              Anticipated d/c date is:??              Patient currently extremely unstable      Consultants:    Procedures/Significant Events:    I have personally reviewed and interpreted all radiology studies and my findings are as above.  VENTILATOR SETTINGS: HFNC 9/7 Flow 8 L/min SPO2 93%  Cultures   Antimicrobials: Anti-infectives (From admission, onward)   Start      Ordered Stop   06/03/20 0000  cephALEXin (KEFLEX) capsule 500 mg  Status:  Discontinued        06/02/20 2045 06/06/20 1939   06/01/20 1000  remdesivir 100 mg in sodium chloride 0.9 % 100 mL IVPB       "Followed by" Linked Group Details   05/31/20 0144 06/04/20 0926   05/31/20 0145  remdesivir 200 mg in sodium chloride 0.9% 250 mL IVPB       "Followed by" Linked Group Details   05/31/20 0144 05/31/20 0616       Devices    LINES / TUBES:      Continuous Infusions:   Objective: Vitals:   06/13/20 0524 06/13/20 0606 06/13/20 0819 06/13/20 0830  BP:   (!) 147/87   Pulse: 75 88 (!) 102   Resp:   19   Temp:   98.6 F (37 C)   TempSrc:   Oral   SpO2: 91% 91% (!) 88% 93%  Weight:      Height:        Intake/Output Summary (Last 24 hours) at 06/13/2020 0954 Last data filed at 06/13/2020 0355 Gross per 24 hour  Intake --  Output 800 ml  Net -800 ml   Filed Weights   05/30/20 2052  Weight: 124.7 kg   Physical Exam:  General: A/O x4, positive for  acute respiratory distress, positive DOE, Eyes: negative scleral hemorrhage, negative anisocoria, negative icterus ENT: Negative Runny nose, negative gingival bleeding, Neck:  Negative scars, masses, torticollis, lymphadenopathy, JVD Lungs: Clear to auscultation bilaterally without wheezes or crackles Cardiovascular: Regular rate and rhythm without murmur gallop or rub normal S1 and S2 Abdomen: negative abdominal pain, nondistended, positive soft, bowel sounds, no rebound, no ascites, no appreciable mass Extremities: No significant cyanosis, clubbing, or edema bilateral lower extremities Skin: Negative rashes, lesions, ulcers Psychiatric:  Negative depression, negative anxiety, negative fatigue, negative mania  Central nervous system:  Cranial nerves II through XII intact, tongue/uvula midline, all extremities muscle strength 5/5, sensation intact throughout, negative dysarthria, negative expressive aphasia, negative receptive  aphasia.  .     Data Reviewed: Care during the described time interval was provided by me .  I have reviewed this patient's available data, including medical history, events of note, physical examination, and all test results as part of my evaluation.  CBC: Recent Labs  Lab 06/08/20 0439 06/09/20 0707 06/10/20 0741 06/11/20 0509 06/12/20 0618  WBC 14.5* 15.4* 14.7* 17.6* 14.3*  NEUTROABS 12.6* 13.9* 13.5* 16.3* 13.4*  HGB 15.5 15.7 15.5 15.8 15.4  HCT 42.0 43.6 43.8 43.5 41.6  MCV 81.9 84.2 85.0 83.0 82.5  PLT 520* 526* 465* 484* 406*   Basic Metabolic Panel: Recent Labs  Lab 06/08/20 0439 06/08/20 0439 06/09/20 0707 06/10/20 0741 06/11/20 0509 06/12/20 0618 06/13/20 0602  NA 137  --  134* 134* 134* 132*  --   K 3.8  --  3.8 4.0 4.2 4.0  --  CL 100  --  97* 96* 95* 90*  --   CO2 31  --  25 27 26 31   --   GLUCOSE 176*  --  187* 175* 171* 196*  --   BUN 29*  --  31* 32* 32* 29*  --   CREATININE 0.86  --  1.07 1.18 1.20 0.97  --   CALCIUM 8.4*  --  8.5* 8.4* 8.7* 8.5*  --   MG 2.8*   < > 3.2* 3.0* 2.8* 2.6* 2.6*  PHOS 4.5   < > 4.1 3.6 3.9 4.0 4.3   < > = values in this interval not displayed.   GFR: Estimated Creatinine Clearance: 112.8 mL/min (by C-G formula based on SCr of 0.97 mg/dL). Liver Function Tests: Recent Labs  Lab 06/08/20 0439 06/09/20 0707 06/10/20 0741 06/11/20 0509 06/12/20 0618  AST 31 30 28  32 30  ALT 69* 63* 69* 78* 83*  ALKPHOS 71 72 70 69 66  BILITOT 1.0 1.2 1.1 1.0 1.0  PROT 6.3* 6.4* 6.5 6.5 6.4*  ALBUMIN 3.4* 3.5 3.5 3.5 3.4*   No results for input(s): LIPASE, AMYLASE in the last 168 hours. No results for input(s): AMMONIA in the last 168 hours. Coagulation Profile: No results for input(s): INR, PROTIME in the last 168 hours. Cardiac Enzymes: No results for input(s): CKTOTAL, CKMB, CKMBINDEX, TROPONINI in the last 168 hours. BNP (last 3 results) No results for input(s): PROBNP in the last 8760 hours. HbA1C: No results for  input(s): HGBA1C in the last 72 hours. CBG: No results for input(s): GLUCAP in the last 168 hours. Lipid Profile: No results for input(s): CHOL, HDL, LDLCALC, TRIG, CHOLHDL, LDLDIRECT in the last 72 hours. Thyroid Function Tests: No results for input(s): TSH, T4TOTAL, FREET4, T3FREE, THYROIDAB in the last 72 hours. Anemia Panel: Recent Labs    06/12/20 0618 06/13/20 0602  FERRITIN 1,314* 1,546*   Sepsis Labs: No results for input(s): PROCALCITON, LATICACIDVEN in the last 168 hours.  No results found for this or any previous visit (from the past 240 hour(s)).       Radiology Studies: No results found.      Scheduled Meds: . vitamin C  500 mg Oral Daily  . baricitinib  4 mg Oral Daily  . enoxaparin (LOVENOX) injection  60 mg Subcutaneous Q24H  . furosemide  20 mg Intravenous BID  . Ipratropium-Albuterol  1 puff Inhalation QID  . methylPREDNISolone (SOLU-MEDROL) injection  60 mg Intravenous TID  . saccharomyces boulardii  250 mg Oral BID  . zinc sulfate  220 mg Oral Daily   Continuous Infusions:   LOS: 13 days    Time spent:40 min    Kayln Garceau, 08/12/20, MD [facility] Pager [phone]  If 7PM-7AM, please contact night-coverage [email or web address] Password [facility] 06/13/2020, 9:54 AM

## 2020-06-13 NOTE — Progress Notes (Signed)
SATURATION QUALIFICATIONS: (This note is used to comply with regulatory documentation for home oxygen)  Patient Saturations on Room Air at Rest = 93%  Patient Saturations on Room Air while Ambulating = 80%  Patient Saturations on 8 Liters of oxygen while Ambulating = 91%  Please briefly explain why patient needs home oxygen:

## 2020-06-14 LAB — CBC WITH DIFFERENTIAL/PLATELET
Abs Immature Granulocytes: 0.23 10*3/uL — ABNORMAL HIGH (ref 0.00–0.07)
Basophils Absolute: 0 10*3/uL (ref 0.0–0.1)
Basophils Relative: 0 %
Eosinophils Absolute: 0 10*3/uL (ref 0.0–0.5)
Eosinophils Relative: 0 %
HCT: 46 % (ref 39.0–52.0)
Hemoglobin: 16.8 g/dL (ref 13.0–17.0)
Immature Granulocytes: 2 %
Lymphocytes Relative: 3 %
Lymphs Abs: 0.5 10*3/uL — ABNORMAL LOW (ref 0.7–4.0)
MCH: 30.7 pg (ref 26.0–34.0)
MCHC: 36.5 g/dL — ABNORMAL HIGH (ref 30.0–36.0)
MCV: 84.1 fL (ref 80.0–100.0)
Monocytes Absolute: 0.4 10*3/uL (ref 0.1–1.0)
Monocytes Relative: 3 %
Neutro Abs: 13.7 10*3/uL — ABNORMAL HIGH (ref 1.7–7.7)
Neutrophils Relative %: 92 %
Platelets: 378 10*3/uL (ref 150–400)
RBC: 5.47 MIL/uL (ref 4.22–5.81)
RDW: 13.2 % (ref 11.5–15.5)
WBC: 14.8 10*3/uL — ABNORMAL HIGH (ref 4.0–10.5)
nRBC: 0 % (ref 0.0–0.2)

## 2020-06-14 LAB — FIBRIN DERIVATIVES D-DIMER (ARMC ONLY): Fibrin derivatives D-dimer (ARMC): 470.37 ng/mL (FEU) (ref 0.00–499.00)

## 2020-06-14 LAB — COMPREHENSIVE METABOLIC PANEL
ALT: 104 U/L — ABNORMAL HIGH (ref 0–44)
AST: 34 U/L (ref 15–41)
Albumin: 3.6 g/dL (ref 3.5–5.0)
Alkaline Phosphatase: 76 U/L (ref 38–126)
Anion gap: 15 (ref 5–15)
BUN: 33 mg/dL — ABNORMAL HIGH (ref 6–20)
CO2: 30 mmol/L (ref 22–32)
Calcium: 8.9 mg/dL (ref 8.9–10.3)
Chloride: 88 mmol/L — ABNORMAL LOW (ref 98–111)
Creatinine, Ser: 1.05 mg/dL (ref 0.61–1.24)
GFR calc Af Amer: >60 mL/min (ref >60)
GFR calc non Af Amer: >60 mL/min (ref >60)
Glucose, Bld: 179 mg/dL — ABNORMAL HIGH (ref 70–99)
Potassium: 4 mmol/L (ref 3.5–5.1)
Sodium: 133 mmol/L — ABNORMAL LOW (ref 135–145)
Total Bilirubin: 1.2 mg/dL (ref 0.3–1.2)
Total Protein: 6.7 g/dL (ref 6.5–8.1)

## 2020-06-14 LAB — MAGNESIUM: Magnesium: 2.6 mg/dL — ABNORMAL HIGH (ref 1.7–2.4)

## 2020-06-14 LAB — C-REACTIVE PROTEIN: CRP: 0.5 mg/dL (ref <1.0)

## 2020-06-14 LAB — FERRITIN: Ferritin: 2006 ng/mL — ABNORMAL HIGH (ref 24–336)

## 2020-06-14 LAB — PHOSPHORUS: Phosphorus: 4.8 mg/dL — ABNORMAL HIGH (ref 2.5–4.6)

## 2020-06-14 LAB — LACTATE DEHYDROGENASE: LDH: 358 U/L — ABNORMAL HIGH (ref 98–192)

## 2020-06-14 NOTE — Progress Notes (Signed)
Physical Therapy Treatment Patient Details Name: Dwayne Dalton MRN: [id number] DOB: 1966/04/25 Today's Date: 06/14/2020    History of Present Illness 54 y.o. male with no significant medical history who presents with fever, chills, cough and shortness of breath as well as chest pain now with associated diarrhea.  Chest pain is worse on taking a deep breath.  Patient is unvaccinated against Covid. On arrival febrile at 102.7, tachycardia at 110, tachypneic at 28 with O2 sats 93% on room air.  Covid positive.  Chest x-ray consistent with viral pneumonia.     PT Comments    Pt was side lying in bed asleep upon arriving. He easily awakes and agrees to PT session. HFNC set on 9 L but pt did not have actually in nose.Was currently sitting at 94% sao2 without [place] in nose. Reapplied HFNC and reduced top 5 L from 9 L. Pt was able to exit R side of bed and get to [facility] with MOD I. Stands with supervision. Pt able to maintain > 90% on 5 L HFNC throughout session. He ambulated in hallway and around unit 200 ft with pushing IV pole for support. NO LOB or unsteadiness. Overall p is progressing well. RN aware of pt's abilities and that O2 was reduced and left reduced after session. Acute PT will continue to follow per POC. Recommend home with HHPT at DC.    Follow Up Recommendations  Supervision - Intermittent;Home health PT     Equipment Recommendations  Rolling walker with 5" wheels    Recommendations for Other Services       Precautions / Restrictions Precautions Precautions: Fall Restrictions Weight Bearing Restrictions: No    Mobility  Bed Mobility Overal bed mobility: Modified Independent             General bed mobility comments: no physical assistance required to exit bed  Transfers Overall transfer level: Needs assistance Equipment used: None (IV pole) Transfers: Sit to/from Stand Sit to Stand: Supervision         General transfer comment: no physical assistance to stand from  EOB/recliner/BSC  Ambulation/Gait Ambulation/Gait assistance: Supervision Gait Distance (Feet): 200 Feet Assistive device: IV Pole Gait Pattern/deviations: Step-through pattern Gait velocity: decreased   General Gait Details: pt was able to ambulate 200 ft with IV pole for support on 5 L HFNC without difficulty. Pt could have ambulated further but requested to get a drink. returned to room and pt sat in recliner        Cognition Arousal/Alertness: Awake/alert Behavior During Therapy: WFL for tasks assessed/performed Overall Cognitive Status: Within Functional Limits for tasks assessed                                 General Comments: Pt was on HFNC 9L upon arriving with sao2 97%. decreasesd to 5 L HFNC throughout session with tp maintaining > 90 % throughout             Pertinent Vitals/Pain Pain Assessment: No/denies pain           PT Goals (current goals can now be found in the care plan section) Acute Rehab PT Goals Patient Stated Goal: go home Progress towards PT goals: Progressing toward goals    Frequency    Min 2X/week      PT Plan Current plan remains appropriate       AM-PAC PT "6 Clicks" Mobility   Outcome Measure  Help needed turning  from your back to your side while in a flat bed without using bedrails?: None Help needed moving from lying on your back to sitting on the side of a flat bed without using bedrails?: None Help needed moving to and from a bed to a chair (including a wheelchair)?: A Little Help needed standing up from a chair using your arms (e.g., wheelchair or bedside chair)?: A Little Help needed to walk in hospital room?: A Little Help needed climbing 3-5 steps with a railing? : A Little 6 Click Score: 20    End of Session Equipment Utilized During Treatment: Gait belt;Oxygen Activity Tolerance: Patient tolerated treatment well Patient left: in chair;with call bell/phone within reach;with chair alarm set Nurse  Communication: Mobility status PT Visit Diagnosis: Muscle weakness (generalized) (M62.81);Difficulty in walking, not elsewhere classified (R26.2)     Time: 3762-8315 PT Time Calculation (min) (ACUTE ONLY): 25 min  Charges:  $Gait Training: 8-22 mins $Therapeutic Activity: 8-22 mins                     Jetta Lout PTA 06/14/20, 4:19 PM

## 2020-06-14 NOTE — Progress Notes (Signed)
PROGRESS NOTE    Dwayne Dalton  AYT:[id number] DOB: [birth year] DOA: 05/30/2020 PCP: Patient, No Pcp Per   Brief Narrative:  Presents with a 1 week history of fever chills cough and shortness of breath as well as chest pain now with associated diarrhea. Chest pain is worse on taking a deep breath. Patient is unvaccinated against Covid. ED Course:On arrival febrile at 102.7, tachycardia at 110, tachypneic at 28 with O2 sats 93% on room air. Covid positive. Chest x-ray consistent with viral pneumonia. Blood work showing mild hyponatremia of 129 with mild AKI, creatinine 1.26 with bicarb of 17. Lactic acid 1.5.Patient was hydrated in the ER. Hospitalist consulted for admission.   Assessment & Plan:   Active Problems:   Pneumonia due to COVID-19 virus   Diarrhea due to COVID-19   AKI (acute kidney injury) (HCC)   Metabolic acidosis   Obesity, Class III, BMI 40-49.9 (morbid obesity) (HCC)   COVID-19 Acute respiratory failure with hypoxia/Covid pneumonia COVID-19 Labs  Recent Labs    06/12/20 0618 06/13/20 0602 06/14/20 0527  FERRITIN 1,314* 1,546* 2,006*  LDH 393* 388* 358*  CRP 0.7 <0.5 0.5    Lab Results  Component Value Date   SARSCOV2NAA POSITIVE (A) 05/30/2020    -8/24 Remdesivir per pharmacy protocol -8/24 Baricitinib x14-day course per pharmacy protocol.  Per EMR HCPOA okayed use of this experimental medicationThe treatment plan and use of medications and known side effects were discussed with patient/family. It was clearly explained that Complete risks and long-term side effects are unknown, however in the best clinical judgment they seem to be of some clinical benefit rather than medical risks. Patient/family agree with the treatment plan and want to receive these treatments as indicated. -Vitamin C and zinc per Covid protocol -Combivent QID -Flutter valve -Incentive spirometry -Patient counseled at length that currently his O2 requirements are too high to  go without oxygen and that he is going to pass out if he attempts to remove his O2 to try to prove that he is ready for discharge. SATURATION QUALIFICATIONS: (Thisnote is usedto comply with regulatory documentation for home oxygen) Patient Saturations on Room Air at Rest =93% Patient Saturations on [employer] while Ambulating =80% Patient Saturations on8Liters of oxygen while Ambulating = 91% Please briefly explain why patient needs home oxygen: -Patient meets criteria for home O2 -Patient still requiring too much O2 to safely discharge (8 L) -Pt still needs high amount of oxygen prior to safe discharge.   Chronic lower extremity edema -Did not appreciate on exam  Obesity, morbid (BMI 43.07 kg/m) -Placing the patient at high risk for poor outcomes with COVID-19  Diarrhea. -Florastor. Add Imodium  Left lower extremity cellulitis. -Completed course of Keflex.  Developmentally delayed -Easy to reorient -Not sure patient fully understands his situation -Who is his HCPOA?  Goals of care -PT/OT recommend home health -9/8 NCM consider LTAC given that patient's O2 demands when ambulating are too high to safely discharged home   DVT prophylaxis: Lovenox. Code Status: FULL Family Communication:None at bedside Disposition Plan:TBD. Subjective: 9/7 afebrile overnight A/O x4, negative abdominal pain, positive S OB.  Patient doing well on room air during exam will obtain ambulatory SPO2 on patient  9/8 afebrile ,Vitals are stable and labs are stable.pt is on HFNC with o2 sats of 93%.  Objective: Vitals:   06/13/20 1945 06/13/20 2351 06/14/20 0354 06/14/20 0852  BP: (!) 130/91 119/80 122/89 (!) 131/96  Pulse: 76 79 93 (!) 109  Resp: 17 18 18  20  Temp: 98 F (36.7 C) 98.5 F (36.9 C) 98.2 F (36.8 C) 98.3 F (36.8 C)  TempSrc: Oral  Oral Oral  SpO2: 97% 97% 100% 93%  Weight:      Height:        Intake/Output Summary (Last 24 hours) at 06/14/2020 0853 Last  data filed at 06/14/2020 0400 Gross per 24 hour  Intake --  Output 775 ml  Net -775 ml   Filed Weights   05/30/20 2052  Weight: 124.7 kg    Examination: Blood pressure (!) 131/96, pulse (!) 109, temperature 98.3 F (36.8 C), temperature source Oral, resp. rate 20, height 5\' 7"  (1.702 m), weight 124.7 kg, SpO2 93 %. General exam: Appears calm and comfortable  Respiratory system: Clear to auscultation. Respiratory effort normal. Cardiovascular system: S1 & S2 heard, RRR. No JVD, murmurs, rubs, gallops or clicks. No pedal edema. Gastrointestinal system: Abdomen is nondistended, soft and nontender. No organomegaly or masses felt. Normal bowel sounds heard. Central nervous system: Alert and oriented. No focal neurological deficits. Extremities: Symmetric 5 x 5 power. Skin: No rashes, lesions or ulcers Psychiatry: Judgement and insight appear normal. Mood & affect appropriate.   Data Reviewed: I have personally reviewed following labs and imaging studies  CBC: Recent Labs  Lab 06/10/20 0741 06/11/20 0509 06/12/20 0618 06/13/20 0602 06/14/20 0527  WBC 14.7* 17.6* 14.3* 15.0* 14.8*  NEUTROABS 13.5* 16.3* 13.4* 13.7* 13.7*  HGB 15.5 15.8 15.4 15.8 16.8  HCT 43.8 43.5 41.6 45.5 46.0  MCV 85.0 83.0 82.5 85.7 84.1  PLT 465* 484* 406* 455* 378   Basic Metabolic Panel: Recent Labs  Lab 06/10/20 0741 06/11/20 0509 06/12/20 0618 06/13/20 0602 06/14/20 0527  NA 134* 134* 132* 131* 133*  K 4.0 4.2 4.0 4.3 4.0  CL 96* 95* 90* 88* 88*  CO2 27 26 31 29 30   GLUCOSE 175* 171* 196* 194* 179*  BUN 32* 32* 29* 31* 33*  CREATININE 1.18 1.20 0.97 1.21 1.05  CALCIUM 8.4* 8.7* 8.5* 8.9 8.9  MG 3.0* 2.8* 2.6* 2.6* 2.6*  PHOS 3.6 3.9 4.0 4.3 4.8*   GFR: Estimated Creatinine Clearance: 104.2 mL/min (by C-G formula based on SCr of 1.05 mg/dL). Liver Function Tests: Recent Labs  Lab 06/10/20 0741 06/11/20 0509 06/12/20 0618 06/13/20 0602 06/14/20 0527  AST 28 32 30 32 34  ALT 69* 78*  83* 91* 104*  ALKPHOS 70 69 66 67 76  BILITOT 1.1 1.0 1.0 1.1 1.2  PROT 6.5 6.5 6.4* 6.9 6.7  ALBUMIN 3.5 3.5 3.4* 3.6 3.6   Anemia Panel: Recent Labs    06/13/20 0602 06/14/20 0527  FERRITIN 1,546* 2,006*   Radiology Studies: No results found.  Scheduled Meds: . vitamin C  500 mg Oral Daily  . baricitinib  4 mg Oral Daily  . enoxaparin (LOVENOX) injection  40 mg Subcutaneous Q12H  . furosemide  20 mg Intravenous BID  . Ipratropium-Albuterol  1 puff Inhalation QID  . methylPREDNISolone (SOLU-MEDROL) injection  60 mg Intravenous TID  . saccharomyces boulardii  250 mg Oral BID  . zinc sulfate  220 mg Oral Daily   Continuous Infusions:   LOS: 14 days    08/14/20, MD [facility] Pager [phone] If 7PM-7AM, please contact night-coverage [email or web address] Password [facility] 06/14/2020, 8:53 AM

## 2020-06-14 NOTE — Progress Notes (Signed)
Father given daily update

## 2020-06-15 LAB — CBC WITH DIFFERENTIAL/PLATELET
Abs Immature Granulocytes: 0.23 10*3/uL — ABNORMAL HIGH (ref 0.00–0.07)
Basophils Absolute: 0 10*3/uL (ref 0.0–0.1)
Basophils Relative: 0 %
Eosinophils Absolute: 0 10*3/uL (ref 0.0–0.5)
Eosinophils Relative: 0 %
HCT: 42.9 % (ref 39.0–52.0)
Hemoglobin: 15.6 g/dL (ref 13.0–17.0)
Immature Granulocytes: 2 %
Lymphocytes Relative: 4 %
Lymphs Abs: 0.6 10*3/uL — ABNORMAL LOW (ref 0.7–4.0)
MCH: 30.6 pg (ref 26.0–34.0)
MCHC: 36.4 g/dL — ABNORMAL HIGH (ref 30.0–36.0)
MCV: 84.3 fL (ref 80.0–100.0)
Monocytes Absolute: 0.8 10*3/uL (ref 0.1–1.0)
Monocytes Relative: 5 %
Neutro Abs: 13.5 10*3/uL — ABNORMAL HIGH (ref 1.7–7.7)
Neutrophils Relative %: 89 %
Platelets: 341 10*3/uL (ref 150–400)
RBC: 5.09 MIL/uL (ref 4.22–5.81)
RDW: 13.1 % (ref 11.5–15.5)
WBC: 15.2 10*3/uL — ABNORMAL HIGH (ref 4.0–10.5)
nRBC: 0 % (ref 0.0–0.2)

## 2020-06-15 LAB — COMPREHENSIVE METABOLIC PANEL
ALT: 108 U/L — ABNORMAL HIGH (ref 0–44)
AST: 34 U/L (ref 15–41)
Albumin: 3.3 g/dL — ABNORMAL LOW (ref 3.5–5.0)
Alkaline Phosphatase: 69 U/L (ref 38–126)
Anion gap: 14 (ref 5–15)
BUN: 31 mg/dL — ABNORMAL HIGH (ref 6–20)
CO2: 31 mmol/L (ref 22–32)
Calcium: 8.9 mg/dL (ref 8.9–10.3)
Chloride: 86 mmol/L — ABNORMAL LOW (ref 98–111)
Creatinine, Ser: 1.05 mg/dL (ref 0.61–1.24)
GFR calc Af Amer: >60 mL/min (ref >60)
GFR calc non Af Amer: >60 mL/min (ref >60)
Glucose, Bld: 226 mg/dL — ABNORMAL HIGH (ref 70–99)
Potassium: 4.3 mmol/L (ref 3.5–5.1)
Sodium: 131 mmol/L — ABNORMAL LOW (ref 135–145)
Total Bilirubin: 1.3 mg/dL — ABNORMAL HIGH (ref 0.3–1.2)
Total Protein: 6.3 g/dL — ABNORMAL LOW (ref 6.5–8.1)

## 2020-06-15 LAB — BRAIN NATRIURETIC PEPTIDE: B Natriuretic Peptide: 55 pg/mL (ref 0.0–100.0)

## 2020-06-15 LAB — C-REACTIVE PROTEIN: CRP: 1.2 mg/dL — ABNORMAL HIGH (ref <1.0)

## 2020-06-15 LAB — FERRITIN: Ferritin: 1982 ng/mL — ABNORMAL HIGH (ref 24–336)

## 2020-06-15 LAB — MAGNESIUM: Magnesium: 2.5 mg/dL — ABNORMAL HIGH (ref 1.7–2.4)

## 2020-06-15 LAB — FIBRIN DERIVATIVES D-DIMER (ARMC ONLY): Fibrin derivatives D-dimer (ARMC): 372.4 ng/mL (FEU) (ref 0.00–499.00)

## 2020-06-15 LAB — PHOSPHORUS: Phosphorus: 4 mg/dL (ref 2.5–4.6)

## 2020-06-15 LAB — LACTATE DEHYDROGENASE: LDH: 305 U/L — ABNORMAL HIGH (ref 98–192)

## 2020-06-15 MED ORDER — METHYLPREDNISOLONE SODIUM SUCC 125 MG IJ SOLR
60.0000 mg | Freq: Two times a day (BID) | INTRAMUSCULAR | Status: DC
  Administered 2020-06-15 – 2020-06-19 (×8): 60 mg via INTRAVENOUS
  Filled 2020-06-15 (×8): qty 2

## 2020-06-15 NOTE — Progress Notes (Signed)
Father returned call, daily update given to his father

## 2020-06-15 NOTE — Progress Notes (Signed)
Pts father called to give daily update, no answer, left a message for a return call

## 2020-06-15 NOTE — Progress Notes (Signed)
SATURATION QUALIFICATIONS: (This note is used to comply with regulatory documentation for home oxygen)  Patient Saturations on Room Air at Rest = 86%  Patient Saturations on 3 Liters HFNC of oxygen at Rest = 93%

## 2020-06-15 NOTE — Progress Notes (Signed)
PROGRESS NOTE    Dwayne Dalton  JSE:[id number] DOB: 02-Mar-1966 DOA: 05/30/2020 PCP: Patient, No Pcp Per   Brief Narrative:  Presents with a 1 week history of fever chills cough and shortness of breath as well as chest pain now with associated diarrhea. Chest pain is worse on taking a deep breath. Patient is unvaccinated against Covid. ED Course:On arrival febrile at 102.7, tachycardia at 110, tachypneic at 28 with O2 sats 93% on room air. Covid positive. Chest x-ray consistent with viral pneumonia. Blood work showing mild hyponatremia of 129 with mild AKI, creatinine 1.26 with bicarb of 17. Lactic acid 1.5.Patient was hydrated in the ER. Hospitalist consulted for admission.   Assessment & Plan:   Active Problems:   Pneumonia due to COVID-19 virus   Diarrhea due to COVID-19   AKI (acute kidney injury) (HCC)   Metabolic acidosis   Obesity, Class III, BMI 40-49.9 (morbid obesity) (HCC)   COVID-19 Acute respiratory failure with hypoxia/Covid pneumonia COVID-19 Labs   Recent Labs    06/13/20 0602 06/14/20 0527 06/15/20 0651  FERRITIN 1,546* 2,006* 1,982*  LDH 388* 358* 305*  CRP <0.5 0.5  --    Lab Results  Component Value Date   SARSCOV2NAA POSITIVE (A) 05/30/2020  SpO2: 94 % O2 Flow Rate (L/min): 5 L/min FiO2 (%): (!) 2 %   -8/24 Remdesivir per pharmacy protocol -8/24 Baricitinib x14-day course per pharmacy protocol.  Per EMR HCPOA okayed use of this experimental medicationThe treatment plan and use of medications and known side effects were discussed with patient/family. It was clearly explained that Complete risks and long-term side effects are unknown, however in the best clinical judgment they seem to be of some clinical benefit rather than medical risks. Patient/family agree with the treatment plan and want to receive these treatments as indicated. -Vitamin C and zinc per Covid protocol -Combivent QID -Flutter valve -Incentive spirometry -Patient counseled  at length that currently his O2 requirements are too high to go without oxygen and that he is going to pass out if he attempts to remove his O2 to try to prove that he is ready for discharge. SATURATION QUALIFICATIONS: (Thisnote is usedto comply with regulatory documentation for home oxygen) Patient Saturations on Room Air at Rest =93% Patient Saturations on [employer] while Ambulating =80% Patient Saturations on8Liters of oxygen while Ambulating = 91% Please briefly explain why patient needs home oxygen: -Patient meets criteria for home O2 -Patient still requiring too much O2 to safely discharge (8 L) -Pt still needs high amount of oxygen prior to safe discharge.   Chronic lower extremity edema -Did not appreciate on exam  Obesity, morbid (BMI 43.07 kg/m) -Placing the patient at high risk for poor outcomes with COVID-19 -- counseling once pt is stable and upon d/c.   Diarrhea. -Florastor. Add Imodium -resolved.   Left lower extremity cellulitis. -Completed course of Keflex.  Developmentally delayed -Easy to reorient -Not sure patient fully understands his situation -Who is his HCPOA?  Goals of care -PT/OT recommend home health -9/8 NCM consider LTAC given that patient's O2 demands when ambulating are too high to safely discharged home -d/w CM pt is to go home.    DVT prophylaxis: Lovenox. Code Status: FULL Family Communication:None at bedside Disposition Plan:TBD. Subjective: 9/7 afebrile overnight A/O x4, negative abdominal pain, positive S OB.  Patient doing well on room air during exam will obtain ambulatory SPO2 on patient  9/8 afebrile ,Vitals are stable and labs are stable.pt is on HFNC with o2  sats of 93%.  Objective: Vitals:   06/15/20 0005 06/15/20 0359 06/15/20 0749 06/15/20 1102  BP: (!) 116/91 120/87 (!) 146/102   Pulse: 82 88 98   Resp: 16 18 (!) 23   Temp: 98.2 F (36.8 C) 98 F (36.7 C) 97.8 F (36.6 C)   TempSrc:  Oral Oral    SpO2: 95% 91% 96% (!) 86%  Weight:      Height:        Intake/Output Summary (Last 24 hours) at 06/15/2020 1107 Last data filed at 06/15/2020 0820 Gross per 24 hour  Intake --  Output 600 ml  Net -600 ml   Filed Weights   05/30/20 2052  Weight: 124.7 kg    Examination: Blood pressure (!) 146/102, pulse 98, temperature 97.8 F (36.6 C), temperature source Oral, resp. rate (!) 23, height 5\' 7"  (1.702 m), weight 124.7 kg, SpO2 (!) 86 %. General exam: Appears calm and comfortable  Respiratory system: Clear to auscultation. Respiratory effort normal. Cardiovascular system: S1 & S2 heard, RRR. No JVD, murmurs, rubs, gallops or clicks. No pedal edema. Gastrointestinal system: Abdomen is nondistended, soft and nontender. No organomegaly or masses felt. Normal bowel sounds heard. Central nervous system: Alert and oriented. No focal neurological deficits. Extremities: Symmetric 5 x 5 power. Skin: No rashes, lesions or ulcers Psychiatry: Judgement and insight appear normal. Mood & affect appropriate.   Data Reviewed: I have personally reviewed following labs and imaging studies  CBC: Recent Labs  Lab 06/11/20 0509 06/12/20 0618 06/13/20 0602 06/14/20 0527 06/15/20 0651  WBC 17.6* 14.3* 15.0* 14.8* 15.2*  NEUTROABS 16.3* 13.4* 13.7* 13.7* 13.5*  HGB 15.8 15.4 15.8 16.8 15.6  HCT 43.5 41.6 45.5 46.0 42.9  MCV 83.0 82.5 85.7 84.1 84.3  PLT 484* 406* 455* 378 341   Basic Metabolic Panel: Recent Labs  Lab 06/11/20 0509 06/12/20 0618 06/13/20 0602 06/14/20 0527 06/15/20 0651  NA 134* 132* 131* 133* 131*  K 4.2 4.0 4.3 4.0 4.3  CL 95* 90* 88* 88* 86*  CO2 26 31 29 30 31   GLUCOSE 171* 196* 194* 179* 226*  BUN 32* 29* 31* 33* 31*  CREATININE 1.20 0.97 1.21 1.05 1.05  CALCIUM 8.7* 8.5* 8.9 8.9 8.9  MG 2.8* 2.6* 2.6* 2.6* 2.5*  PHOS 3.9 4.0 4.3 4.8* 4.0   GFR: Estimated Creatinine Clearance: 104.2 mL/min (by C-G formula based on SCr of 1.05 mg/dL). Liver Function  Tests: Recent Labs  Lab 06/11/20 0509 06/12/20 0618 06/13/20 0602 06/14/20 0527 06/15/20 0651  AST 32 30 32 34 34  ALT 78* 83* 91* 104* 108*  ALKPHOS 69 66 67 76 69  BILITOT 1.0 1.0 1.1 1.2 1.3*  PROT 6.5 6.4* 6.9 6.7 6.3*  ALBUMIN 3.5 3.4* 3.6 3.6 3.3*   Anemia Panel: Recent Labs    06/14/20 0527 06/15/20 0651  FERRITIN 2,006* 1,982*   Radiology Studies: No results found.  Scheduled Meds: . vitamin C  500 mg Oral Daily  . baricitinib  4 mg Oral Daily  . enoxaparin (LOVENOX) injection  40 mg Subcutaneous Q12H  . furosemide  20 mg Intravenous BID  . Ipratropium-Albuterol  1 puff Inhalation QID  . methylPREDNISolone (SOLU-MEDROL) injection  60 mg Intravenous TID  . saccharomyces boulardii  250 mg Oral BID  . zinc sulfate  220 mg Oral Daily   Continuous Infusions:   LOS: 15 days    08/15/20, MD [facility] Pager [phone] If 7PM-7AM, please contact night-coverage [email or web address] Password [facility] 06/15/2020,  11:07 AM

## 2020-06-16 ENCOUNTER — Inpatient Hospital Stay: Payer: Managed Care, Other (non HMO)

## 2020-06-16 DIAGNOSIS — J9601 Acute respiratory failure with hypoxia: Secondary | ICD-10-CM

## 2020-06-16 LAB — COMPREHENSIVE METABOLIC PANEL
ALT: 110 U/L — ABNORMAL HIGH (ref 0–44)
AST: 31 U/L (ref 15–41)
Albumin: 3.3 g/dL — ABNORMAL LOW (ref 3.5–5.0)
Alkaline Phosphatase: 69 U/L (ref 38–126)
Anion gap: 13 (ref 5–15)
BUN: 30 mg/dL — ABNORMAL HIGH (ref 6–20)
CO2: 33 mmol/L — ABNORMAL HIGH (ref 22–32)
Calcium: 8.7 mg/dL — ABNORMAL LOW (ref 8.9–10.3)
Chloride: 86 mmol/L — ABNORMAL LOW (ref 98–111)
Creatinine, Ser: 1.09 mg/dL (ref 0.61–1.24)
GFR calc Af Amer: >60 mL/min (ref >60)
GFR calc non Af Amer: >60 mL/min (ref >60)
Glucose, Bld: 198 mg/dL — ABNORMAL HIGH (ref 70–99)
Potassium: 4.3 mmol/L (ref 3.5–5.1)
Sodium: 132 mmol/L — ABNORMAL LOW (ref 135–145)
Total Bilirubin: 1.2 mg/dL (ref 0.3–1.2)
Total Protein: 6.3 g/dL — ABNORMAL LOW (ref 6.5–8.1)

## 2020-06-16 LAB — CBC WITH DIFFERENTIAL/PLATELET
Abs Immature Granulocytes: 0.2 10*3/uL — ABNORMAL HIGH (ref 0.00–0.07)
Basophils Absolute: 0 10*3/uL (ref 0.0–0.1)
Basophils Relative: 0 %
Eosinophils Absolute: 0 10*3/uL (ref 0.0–0.5)
Eosinophils Relative: 0 %
HCT: 41.7 % (ref 39.0–52.0)
Hemoglobin: 15.6 g/dL (ref 13.0–17.0)
Immature Granulocytes: 1 %
Lymphocytes Relative: 5 %
Lymphs Abs: 0.7 10*3/uL (ref 0.7–4.0)
MCH: 30.6 pg (ref 26.0–34.0)
MCHC: 37.4 g/dL — ABNORMAL HIGH (ref 30.0–36.0)
MCV: 81.8 fL (ref 80.0–100.0)
Monocytes Absolute: 0.8 10*3/uL (ref 0.1–1.0)
Monocytes Relative: 5 %
Neutro Abs: 13.8 10*3/uL — ABNORMAL HIGH (ref 1.7–7.7)
Neutrophils Relative %: 89 %
Platelets: 315 10*3/uL (ref 150–400)
RBC: 5.1 MIL/uL (ref 4.22–5.81)
RDW: 13.2 % (ref 11.5–15.5)
WBC: 15.6 10*3/uL — ABNORMAL HIGH (ref 4.0–10.5)
nRBC: 0 % (ref 0.0–0.2)

## 2020-06-16 LAB — LACTATE DEHYDROGENASE: LDH: 300 U/L — ABNORMAL HIGH (ref 98–192)

## 2020-06-16 LAB — PHOSPHORUS: Phosphorus: 4.1 mg/dL (ref 2.5–4.6)

## 2020-06-16 LAB — C-REACTIVE PROTEIN: CRP: <0.5 mg/dL (ref <1.0)

## 2020-06-16 LAB — MAGNESIUM: Magnesium: 2.7 mg/dL — ABNORMAL HIGH (ref 1.7–2.4)

## 2020-06-16 LAB — FERRITIN: Ferritin: 1618 ng/mL — ABNORMAL HIGH (ref 24–336)

## 2020-06-16 LAB — FIBRIN DERIVATIVES D-DIMER (ARMC ONLY): Fibrin derivatives D-dimer (ARMC): 298.08 ng/mL (FEU) (ref 0.00–499.00)

## 2020-06-16 LAB — HEMOGLOBIN A1C
Hgb A1c MFr Bld: 6.5 % — ABNORMAL HIGH (ref 4.8–5.6)
Mean Plasma Glucose: 139.85 mg/dL

## 2020-06-16 MED ORDER — IOHEXOL 350 MG/ML SOLN
100.0000 mL | Freq: Once | INTRAVENOUS | Status: AC | PRN
  Administered 2020-06-16: 17:00:00 100 mL via INTRAVENOUS

## 2020-06-16 NOTE — Progress Notes (Signed)
SATURATION QUALIFICATIONS: (This note is used to comply with regulatory documentation for home oxygen)  Patient Saturations on Room Air at Rest = 88%  Patient Saturations on Room Air while Ambulating = 82-84%  Patient Saturations on 7 Liters of oxygen while Ambulating = 91-92%  Please briefly explain why patient needs home oxygen:

## 2020-06-16 NOTE — Progress Notes (Signed)
Physical Therapy Treatment Patient Details Name: Dwayne Dalton MRN: [id number] DOB: 1966/01/29 Today's Date: 06/16/2020    History of Present Illness 54 y.o. male with no significant medical history who presents with fever, chills, cough and shortness of breath as well as chest pain now with associated diarrhea.  Chest pain is worse on taking a deep breath.  Patient is unvaccinated against Covid. On arrival febrile at 102.7, tachycardia at 110, tachypneic at 28 with O2 sats 93% on room air.  Covid positive.  Chest x-ray consistent with viral pneumonia.     PT Comments    Pt needed plenty of cuing t/o the session to insure that he did, indeed, focus on his breathing.  Pt eager to talk/ask questions and each time he had drop in O2, on 3L t/o session with sats generally in the low 90s when he was focusing on breathing and dropping into the high 80s with talk or light activity.  Apparently RN trialed ambulation on room air earlier with sats dropping into the low 80s relatively quickly.  Per yesterday's PT session he was able to ambulate the loop (and likely could have done more) on 5L, but today on 3L his O2 dropped to the low 80s quickly again and he did have increasing shortness of breath with the effort.  He was very motivated and eager to do what he can but ultimately his O2 sats and fatigue are biggest limiters.  HR appeared stable in the 70-90 range the entire time.  Pt able to ambulate w/o AD/UEs, but was more confident and stable with RW, he does acknowledge that he does a little better with it.     Follow Up Recommendations  Supervision - Intermittent;Home health PT     Equipment Recommendations  Rolling walker with 5" wheels (O2)    Recommendations for Other Services       Precautions / Restrictions Precautions Precautions: Fall Restrictions Weight Bearing Restrictions: No    Mobility  Bed Mobility Overal bed mobility: Modified Independent             General bed mobility  comments: no physical assistance required to exit bed  Transfers Overall transfer level: Modified independent Equipment used: Rolling walker (2 wheeled);None   Sit to Stand: Supervision         General transfer comment: no phyiscal assist requied, multiple times sit to stand t/o session, generally did use RW in attempt to minimize his fatigue with continuing mobility  Ambulation/Gait Ambulation/Gait assistance: Supervision Gait Distance (Feet): 85 Feet Assistive device: Rolling walker (2 wheeled);None       General Gait Details: initially ambulated in room w/o AD on 3L, O2 dropped to low 80s and pt had shortness of breath.  Returned to bed and took seated rest break (on O2) until back into the mid 90s.  Second walk, with walker, ~61ft into the hallway with focused breathing but he again struggled to maintain O2>85 (on 3L) and ultimately dropped to low 80s by return to room.  Maintained 3L and withing 1 minute he was >88% and later slowly rose to the low 90s - again with significant shortness of breath   Stairs             Wheelchair Mobility    Modified Rankin (Stroke Patients Only)       Balance Overall balance assessment: Modified Independent  Cognition Arousal/Alertness: Awake/alert Behavior During Therapy: WFL for tasks assessed/performed Overall Cognitive Status: Within Functional Limits for tasks assessed                                 General Comments: Pt on 3L on arrival, sats ~90%      Exercises      General Comments        Pertinent Vitals/Pain Pain Assessment: No/denies pain    Home Living                      Prior Function            PT Goals (current goals can now be found in the care plan section) Progress towards PT goals: Progressing toward goals    Frequency    Min 2X/week      PT Plan Current plan remains appropriate    Co-evaluation               AM-PAC PT "6 Clicks" Mobility   Outcome Measure  Help needed turning from your back to your side while in a flat bed without using bedrails?: None Help needed moving from lying on your back to sitting on the side of a flat bed without using bedrails?: None Help needed moving to and from a bed to a chair (including a wheelchair)?: A Little Help needed standing up from a chair using your arms (e.g., wheelchair or bedside chair)?: A Little Help needed to walk in hospital room?: A Little Help needed climbing 3-5 steps with a railing? : A Little 6 Click Score: 20    End of Session Equipment Utilized During Treatment: Gait belt;Oxygen Activity Tolerance: Patient tolerated treatment well Patient left: with bed alarm set;with call bell/phone within reach (nurse to place IV) Nurse Communication: Mobility status PT Visit Diagnosis: Muscle weakness (generalized) (M62.81);Difficulty in walking, not elsewhere classified (R26.2)     Time: 1519-1600 PT Time Calculation (min) (ACUTE ONLY): 41 min  Charges:  $Gait Training: 23-37 mins $Therapeutic Activity: 8-22 mins                     Malachi Pro, DPT 06/16/2020, 5:05 PM

## 2020-06-16 NOTE — Progress Notes (Signed)
PROGRESS NOTE    Dwayne Dalton  IHK:[id number] DOB: 02-16-1966 DOA: 05/30/2020 PCP: Patient, No Pcp Per   Brief Narrative:  Presents with a 1 week history of fever chills cough and shortness of breath as well as chest pain now with associated diarrhea. Chest pain is worse on taking a deep breath. Patient is unvaccinated against Covid. ED Course:On arrival febrile at 102.7, tachycardia at 110, tachypneic at 28 with O2 sats 93% on room air. Covid positive. Chest x-ray consistent with viral pneumonia. Blood work showing mild hyponatremia of 129 with mild AKI, creatinine 1.26 with bicarb of 17. Lactic acid 1.5.Patient was hydrated in the ER. Hospitalist consulted for admission.   Assessment & Plan:   Active Problems:   Pneumonia due to COVID-19 virus   Diarrhea due to COVID-19   AKI (acute kidney injury) (HCC)   Metabolic acidosis   Obesity, Class III, BMI 40-49.9 (morbid obesity) (HCC)   COVID-19 Acute respiratory failure with hypoxia/Covid pneumonia COVID-19 Labs   Recent Labs    06/14/20 0527 06/15/20 0651 06/16/20 0510  FERRITIN 2,006* 1,982* 1,618*  LDH 358* 305* 300*  CRP 0.5 1.2* <0.5   Lab Results  Component Value Date   SARSCOV2NAA POSITIVE (A) 05/30/2020  SpO2: 95 % O2 Flow Rate (L/min): 2 L/min FiO2 (%): (!) 2 %   -8/24 Remdesivir per pharmacy protocol -8/24 Baricitinib x14-day course per pharmacy protocol.  Per EMR HCPOA okayed use of this experimental medicationThe treatment plan and use of medications and known side effects were discussed with patient/family. It was clearly explained that Complete risks and long-term side effects are unknown, however in the best clinical judgment they seem to be of some clinical benefit rather than medical risks. Patient/family agree with the treatment plan and want to receive these treatments as indicated. -Vitamin C and zinc per Covid protocol -Combivent QID -Flutter valve -Incentive spirometry -Patient counseled  at length that currently his O2 requirements are too high to go without oxygen and that he is going to pass out if he attempts to remove his O2 to try to prove that he is ready for discharge. SATURATION QUALIFICATIONS: (Thisnote is usedto comply with regulatory documentation for home oxygen) Patient Saturations on Room Air at Rest =93% Patient Saturations on [employer] while Ambulating =80% Patient Saturations on8Liters of oxygen while Ambulating = 91% Please briefly explain why patient needs home oxygen: -Patient meets criteria for home O2 -Patient still requiring too much O2 to safely discharge (8 L) -Pt still needs high amount of oxygen prior to safe discharge.  --pt is desatting with ambulation and needs 7L [place] and is not safe for d/c. We will repeat imagine study to identify any PE.    Chronic lower extremity edema -Did not appreciate on exam --resolved.   Obesity, morbid (BMI 43.07 kg/m) -Placing the patient at high risk for poor outcomes with COVID-19 -- counseling once pt is stable and upon d/c. --A1c.   Diarrhea. -Florastor. Add Imodium -resolved.   Left lower extremity cellulitis. -Completed course of Keflex.  Developmentally delayed -Easy to reorient -Not sure patient fully understands his situation -Who is his HCPOA?  Goals of care -PT/OT recommend home health -9/8 NCM consider LTAC given that patient's O2 demands when ambulating are too high to safely discharged home -d/w CM pt is to go home.    DVT prophylaxis: Lovenox. Code Status: FULL Family Communication:None at bedside Disposition Plan:TBD. Subjective: 9/7 afebrile overnight A/O x4, negative abdominal pain, positive S OB.  Patient doing  well on room air during exam will obtain ambulatory SPO2 on patient  9/8 afebrile ,Vitals are stable and labs are stable.pt is on HFNC with o2 sats of 93%.  9/9: Pt was alert awake and keen to go home.  D/w him that we will decide if he is  appropriate for d/c.   9/10; Pt is awake and oriented and denies any complaints.  Ambulating pulse ox is low and requires about 7 L to bring sat to 90's.   Objective: Vitals:   06/16/20 0200 06/16/20 0426 06/16/20 0731 06/16/20 1145  BP:  126/84 127/87 (!) 114/95  Pulse: 77 80 97 98  Resp:  20 17 20   Temp:  98.3 F (36.8 C) 98.7 F (37.1 C) 98.4 F (36.9 C)  TempSrc:   Oral Oral  SpO2: 95% 98% 93% 95%  Weight:      Height:        Intake/Output Summary (Last 24 hours) at 06/16/2020 1441 Last data filed at 06/16/2020 1300 Gross per 24 hour  Intake 960 ml  Output --  Net 960 ml   Filed Weights   05/30/20 2052  Weight: 124.7 kg    Examination: Blood pressure (!) 114/95, pulse 98, temperature 98.4 F (36.9 C), temperature source Oral, resp. rate 20, height 5\' 7"  (1.702 m), weight 124.7 kg, SpO2 95 %. General exam: Appears calm and comfortable  Respiratory system: Clear to auscultation. Respiratory effort normal. Cardiovascular system: S1 & S2 heard, RRR. No JVD, murmurs, rubs, gallops or clicks. No pedal edema. Gastrointestinal system: Abdomen is nondistended, soft and nontender. No organomegaly or masses felt. Normal bowel sounds heard. Central nervous system: Alert and oriented. No focal neurological deficits. Extremities: Symmetric 5 x 5 power. Skin: No rashes, lesions or ulcers Psychiatry: Judgement and insight appear normal. Mood & affect appropriate.   Data Reviewed: I have personally reviewed following labs and imaging studies  CBC: Recent Labs  Lab 06/12/20 0618 06/13/20 0602 06/14/20 0527 06/15/20 0651 06/16/20 0510  WBC 14.3* 15.0* 14.8* 15.2* 15.6*  NEUTROABS 13.4* 13.7* 13.7* 13.5* 13.8*  HGB 15.4 15.8 16.8 15.6 15.6  HCT 41.6 45.5 46.0 42.9 41.7  MCV 82.5 85.7 84.1 84.3 81.8  PLT 406* 455* 378 341 315   Basic Metabolic Panel: Recent Labs  Lab 06/12/20 0618 06/13/20 0602 06/14/20 0527 06/15/20 0651 06/16/20 0510  NA 132* 131* 133* 131* 132*   K 4.0 4.3 4.0 4.3 4.3  CL 90* 88* 88* 86* 86*  CO2 31 29 30 31  33*  GLUCOSE 196* 194* 179* 226* 198*  BUN 29* 31* 33* 31* 30*  CREATININE 0.97 1.21 1.05 1.05 1.09  CALCIUM 8.5* 8.9 8.9 8.9 8.7*  MG 2.6* 2.6* 2.6* 2.5* 2.7*  PHOS 4.0 4.3 4.8* 4.0 4.1   GFR: Estimated Creatinine Clearance: 100.4 mL/min (by C-G formula based on SCr of 1.09 mg/dL). Liver Function Tests: Recent Labs  Lab 06/12/20 0618 06/13/20 0602 06/14/20 0527 06/15/20 0651 06/16/20 0510  AST 30 32 34 34 31  ALT 83* 91* 104* 108* 110*  ALKPHOS 66 67 76 69 69  BILITOT 1.0 1.1 1.2 1.3* 1.2  PROT 6.4* 6.9 6.7 6.3* 6.3*  ALBUMIN 3.4* 3.6 3.6 3.3* 3.3*   Anemia Panel: Recent Labs    06/15/20 0651 06/16/20 0510  FERRITIN 1,982* 1,618*   Radiology Studies: No results found.  Scheduled Meds: . vitamin C  500 mg Oral Daily  . baricitinib  4 mg Oral Daily  . enoxaparin (LOVENOX) injection  40 mg  Subcutaneous Q12H  . furosemide  20 mg Intravenous BID  . Ipratropium-Albuterol  1 puff Inhalation QID  . methylPREDNISolone (SOLU-MEDROL) injection  60 mg Intravenous Q12H  . saccharomyces boulardii  250 mg Oral BID  . zinc sulfate  220 mg Oral Daily   Continuous Infusions:   LOS: 16 days    Gertha Calkin, MD [facility] Pager [phone] If 7PM-7AM, please contact night-coverage [email or web address] Password [facility] 06/16/2020, 2:41 PM

## 2020-06-17 DIAGNOSIS — J9600 Acute respiratory failure, unspecified whether with hypoxia or hypercapnia: Secondary | ICD-10-CM

## 2020-06-17 LAB — CBC WITH DIFFERENTIAL/PLATELET
Abs Immature Granulocytes: 0.29 10*3/uL — ABNORMAL HIGH (ref 0.00–0.07)
Basophils Absolute: 0 10*3/uL (ref 0.0–0.1)
Basophils Relative: 0 %
Eosinophils Absolute: 0 10*3/uL (ref 0.0–0.5)
Eosinophils Relative: 0 %
HCT: 40.7 % (ref 39.0–52.0)
Hemoglobin: 15.2 g/dL (ref 13.0–17.0)
Immature Granulocytes: 2 %
Lymphocytes Relative: 5 %
Lymphs Abs: 0.8 10*3/uL (ref 0.7–4.0)
MCH: 30.3 pg (ref 26.0–34.0)
MCHC: 37.3 g/dL — ABNORMAL HIGH (ref 30.0–36.0)
MCV: 81.1 fL (ref 80.0–100.0)
Monocytes Absolute: 1 10*3/uL (ref 0.1–1.0)
Monocytes Relative: 6 %
Neutro Abs: 14.2 10*3/uL — ABNORMAL HIGH (ref 1.7–7.7)
Neutrophils Relative %: 87 %
Platelets: 328 10*3/uL (ref 150–400)
RBC: 5.02 MIL/uL (ref 4.22–5.81)
RDW: 13.2 % (ref 11.5–15.5)
WBC: 16.2 10*3/uL — ABNORMAL HIGH (ref 4.0–10.5)
nRBC: 0 % (ref 0.0–0.2)

## 2020-06-17 LAB — COMPREHENSIVE METABOLIC PANEL
ALT: 109 U/L — ABNORMAL HIGH (ref 0–44)
AST: 31 U/L (ref 15–41)
Albumin: 3.3 g/dL — ABNORMAL LOW (ref 3.5–5.0)
Alkaline Phosphatase: 67 U/L (ref 38–126)
Anion gap: 14 (ref 5–15)
BUN: 25 mg/dL — ABNORMAL HIGH (ref 6–20)
CO2: 31 mmol/L (ref 22–32)
Calcium: 8.6 mg/dL — ABNORMAL LOW (ref 8.9–10.3)
Chloride: 85 mmol/L — ABNORMAL LOW (ref 98–111)
Creatinine, Ser: 1 mg/dL (ref 0.61–1.24)
GFR calc Af Amer: >60 mL/min (ref >60)
GFR calc non Af Amer: >60 mL/min (ref >60)
Glucose, Bld: 187 mg/dL — ABNORMAL HIGH (ref 70–99)
Potassium: 3.6 mmol/L (ref 3.5–5.1)
Sodium: 130 mmol/L — ABNORMAL LOW (ref 135–145)
Total Bilirubin: 0.9 mg/dL (ref 0.3–1.2)
Total Protein: 6.3 g/dL — ABNORMAL LOW (ref 6.5–8.1)

## 2020-06-17 LAB — PHOSPHORUS: Phosphorus: 3.5 mg/dL (ref 2.5–4.6)

## 2020-06-17 LAB — LACTATE DEHYDROGENASE: LDH: 310 U/L — ABNORMAL HIGH (ref 98–192)

## 2020-06-17 LAB — FERRITIN: Ferritin: 1759 ng/mL — ABNORMAL HIGH (ref 24–336)

## 2020-06-17 LAB — MAGNESIUM: Magnesium: 2.4 mg/dL (ref 1.7–2.4)

## 2020-06-17 LAB — C-REACTIVE PROTEIN: CRP: <0.5 mg/dL (ref <1.0)

## 2020-06-17 LAB — FIBRIN DERIVATIVES D-DIMER (ARMC ONLY): Fibrin derivatives D-dimer (ARMC): 374.76 ng/mL (FEU) (ref 0.00–499.00)

## 2020-06-17 NOTE — Consult Note (Signed)
Reason for Consult: Persistent hypoxemia in the setting of COVID-19 pneumonia   Rreferring Physician: Irena Cords, MD  Dwayne Dalton is an 54 y.o. male.  HPI: Patient is a 54 year old male with no prior medical history who presented to [facility] on 30 May 2020 with a 1 week history of increasing shortness of breath, fevers, chills and cough.  It is very challenging to get a history from the patient.  I have reviewed the records to obtain a better clinical picture as the patient is not forthcoming with any symptoms currently.  All he states is that he wants to go home.  We are asked to render opinion as to his ongoing oxygen requirement.  He is currently requiring 3 L/min of O2 to maintain oxygen saturations are 91%.  However on ambulation he does decline and requires 6 L/min.  He appears to be quite impulsive.  He states that he lives alone but has "good neighbors".  Does not appear to have good insight as to his current condition.  He does not endorse any symptomatology dismissing the review of systems stating "I just want to go home".  History reviewed. No pertinent past medical history.  History reviewed. No pertinent surgical history.  History reviewed. No pertinent family history.  Social History   Tobacco Use   Smoking status: Never Smoker   Smokeless tobacco: Never Used  Substance Use Topics   Alcohol use: Never    Allergies: No Known Allergies  Medications: I have reviewed the patient's current medications.  Results for orders placed or performed during the hospital encounter of 05/30/20 (from the past 48 hour(s))  C-reactive protein     Status: None   Collection Time: 06/16/20  5:10 AM  Result Value Ref Range   CRP <0.5 <1.0 mg/dL    Comment: Performed at [facility], [address], [place], [place] [address]  CBC with Differential/Platelet     Status: Abnormal   Collection Time: 06/16/20  5:10 AM  Result Value Ref Range   WBC 15.6 (H) 4.0 - 10.5 K/uL   RBC 5.10  4.22 - 5.81 MIL/uL   Hemoglobin 15.6 13.0 - 17.0 g/dL   HCT 06.2 39 - 52 %   MCV 81.8 80.0 - 100.0 fL   MCH 30.6 26.0 - 34.0 pg   MCHC 37.4 (H) 30.0 - 36.0 g/dL   RDW 69.4 85.4 - 62.7 %   Platelets 315 150 - 400 K/uL   nRBC 0.0 0.0 - 0.2 %   Neutrophils Relative % 89 %   Neutro Abs 13.8 (H) 1.7 - 7.7 K/uL   Lymphocytes Relative 5 %   Lymphs Abs 0.7 0.7 - 4.0 K/uL   Monocytes Relative 5 %   Monocytes Absolute 0.8 0 - 1 K/uL   Eosinophils Relative 0 %   Eosinophils Absolute 0.0 0 - 0 K/uL   Basophils Relative 0 %   Basophils Absolute 0.0 0 - 0 K/uL   Immature Granulocytes 1 %   Abs Immature Granulocytes 0.20 (H) 0.00 - 0.07 K/uL    Comment: Performed at [facility], [address] [place], [place] [address]  Comprehensive metabolic panel     Status: Abnormal   Collection Time: 06/16/20  5:10 AM  Result Value Ref Range   Sodium 132 (L) 135 - 145 mmol/L   Potassium 4.3 3.5 - 5.1 mmol/L   Chloride 86 (L) 98 - 111 mmol/L   CO2 33 (H) 22 - 32 mmol/L   Glucose,  Bld 198 (H) 70 - 99 mg/dL    Comment: Glucose reference range applies only to samples taken after fasting for at least 8 hours.   BUN 30 (H) 6 - 20 mg/dL   Creatinine, Ser 5.46 0.61 - 1.24 mg/dL   Calcium 8.7 (L) 8.9 - 10.3 mg/dL   Total Protein 6.3 (L) 6.5 - 8.1 g/dL   Albumin 3.3 (L) 3.5 - 5.0 g/dL   AST 31 15 - 41 U/L   ALT 110 (H) 0 - 44 U/L   Alkaline Phosphatase 69 38 - 126 U/L   Total Bilirubin 1.2 0.3 - 1.2 mg/dL   GFR calc non Af Amer >60 >60 mL/min   GFR calc Af Amer >60 >60 mL/min   Anion gap 13 5 - 15    Comment: Performed at [facility], [address], [place], [place] [address]  Ferritin     Status: Abnormal   Collection Time: 06/16/20  5:10 AM  Result Value Ref Range   Ferritin 1,618 (H) 24 - 336 ng/mL    Comment: Performed at [facility], [address] [place], [place] [address]  Fibrin derivatives D-Dimer [facility] only)     Status: None   Collection Time: 06/16/20   5:10 AM  Result Value Ref Range   Fibrin derivatives D-dimer ([facility]) 298.08 0.00 - 499.00 ng/mL (FEU)    Comment: (NOTE) <> Exclusion of Venous Thromboembolism (VTE) - OUTPATIENT ONLY   (Emergency Department or [place])    0-499 ng/ml (FEU): With a low to intermediate pretest probability                      for VTE this test result excludes the diagnosis                      of VTE.   >499 ng/ml (FEU) : VTE not excluded; additional work up for VTE is                      required.  <> Testing on Inpatients and Evaluation of Disseminated Intravascular   Coagulation (DIC) Reference Range:   0-499 ng/ml (FEU) Performed at [facility], [address] [place], [place] [address]   Lactate dehydrogenase     Status: Abnormal   Collection Time: 06/16/20  5:10 AM  Result Value Ref Range   LDH 300 (H) 98 - 192 U/L    Comment: Performed at [facility], [address], [place], [place] [address]  Magnesium     Status: Abnormal   Collection Time: 06/16/20  5:10 AM  Result Value Ref Range   Magnesium 2.7 (H) 1.7 - 2.4 mg/dL    Comment: Performed at [facility], [address], [place], [place] [address]  Phosphorus     Status: None   Collection Time: 06/16/20  5:10 AM  Result Value Ref Range   Phosphorus 4.1 2.5 - 4.6 mg/dL    Comment: Performed at [facility], [address] [place], [place] [address]  Hemoglobin A1c     Status: Abnormal   Collection Time: 06/16/20  5:10 AM  Result Value Ref Range   Hgb A1c MFr Bld 6.5 (H) 4.8 - 5.6 %    Comment: (NOTE) Pre diabetes:          5.7%-6.4%  Diabetes:              >6.4%  Glycemic control for   <  7.0% adults with diabetes    Mean Plasma Glucose 139.85 mg/dL    Comment: Performed at [facility], [address] [place], [place] [address]  C-reactive protein     Status: None   Collection Time: 06/17/20  5:50 AM  Result Value Ref Range   CRP <0.5 <1.0 mg/dL    Comment: Performed at [facility], [address] [place], [place] [address]  CBC with Differential/Platelet     Status: Abnormal   Collection Time: 06/17/20  5:50 AM  Result Value Ref Range   WBC 16.2 (H) 4.0 - 10.5 K/uL   RBC 5.02 4.22 - 5.81 MIL/uL   Hemoglobin 15.2 13.0 - 17.0 g/dL   HCT 08.640.7 39 - 52 %   MCV 81.1 80.0 - 100.0 fL   MCH 30.3 26.0 - 34.0 pg   MCHC 37.3 (H) 30.0 - 36.0 g/dL   RDW 57.813.2 46.911.5 - 62.915.5 %   Platelets 328 150 - 400 K/uL   nRBC 0.0 0.0 - 0.2 %   Neutrophils Relative % 87 %   Neutro Abs 14.2 (H) 1.7 - 7.7 K/uL   Lymphocytes Relative 5 %   Lymphs Abs 0.8 0.7 - 4.0 K/uL   Monocytes Relative 6 %   Monocytes Absolute 1.0 0 - 1 K/uL   Eosinophils Relative 0 %   Eosinophils Absolute 0.0 0 - 0 K/uL   Basophils Relative 0 %   Basophils Absolute 0.0 0 - 0 K/uL   Immature Granulocytes 2 %   Abs Immature Granulocytes 0.29 (H) 0.00 - 0.07 K/uL    Comment: Performed at [facility], [address] [place], [place] [address]  Comprehensive metabolic panel     Status: Abnormal   Collection Time: 06/17/20  5:50 AM  Result Value Ref Range   Sodium 130 (L) 135 - 145 mmol/L   Potassium 3.6 3.5 - 5.1 mmol/L   Chloride 85 (L) 98 - 111 mmol/L   CO2 31 22 - 32 mmol/L   Glucose, Bld 187 (H) 70 - 99 mg/dL    Comment: Glucose reference range applies only to samples taken after fasting for at least 8 hours.   BUN 25 (H) 6 - 20 mg/dL   Creatinine, Ser 3.241.00 0.61 - 1.24 mg/dL   Calcium 8.6 (L) 8.9 - 10.3 mg/dL   Total Protein 6.3 (L) 6.5 - 8.1 g/dL   Albumin 3.3 (L) 3.5 - 5.0 g/dL   AST 31 15 - 41 U/L   ALT 109 (H) 0 - 44 U/L   Alkaline Phosphatase 67 38 - 126 U/L   Total Bilirubin 0.9 0.3 - 1.2 mg/dL   GFR calc non Af Amer >60 >60 mL/min   GFR calc Af Amer >60 >60 mL/min   Anion gap 14 5 - 15    Comment: Performed at [facility], [address] [place], [place] [address]  Ferritin     Status: Abnormal   Collection Time: 06/17/20  5:50 AM  Result Value Ref Range    Ferritin 1,759 (H) 24 - 336 ng/mL    Comment: Performed at [facility], [address] [place], [place] [address]  Fibrin derivatives D-Dimer [facility] only)     Status: None   Collection Time: 06/17/20  5:50 AM  Result Value Ref Range   Fibrin derivatives D-dimer ([facility]) 374.76 0.00 - 499.00 ng/mL (FEU)    Comment: (NOTE) <> Exclusion of Venous Thromboembolism (VTE) - OUTPATIENT ONLY   (  Emergency Department or [place])    0-499 ng/ml (FEU): With a low to intermediate pretest probability                      for VTE this test result excludes the diagnosis                      of VTE.   >499 ng/ml (FEU) : VTE not excluded; additional work up for VTE is                      required.  <> Testing on Inpatients and Evaluation of Disseminated Intravascular   Coagulation (DIC) Reference Range:   0-499 ng/ml (FEU) Performed at [facility], [address] [place], [place] [address]   Lactate dehydrogenase     Status: Abnormal   Collection Time: 06/17/20  5:50 AM  Result Value Ref Range   LDH 310 (H) 98 - 192 U/L    Comment: Performed at [facility], [address] [place], [place] [address]  Magnesium     Status: None   Collection Time: 06/17/20  5:50 AM  Result Value Ref Range   Magnesium 2.4 1.7 - 2.4 mg/dL    Comment: Performed at [facility], [address] [place], [place] [address]  Phosphorus     Status: None   Collection Time: 06/17/20  5:50 AM  Result Value Ref Range   Phosphorus 3.5 2.5 - 4.6 mg/dL    Comment: Performed at [facility], [address] [place], [place] [address]    CT ANGIO CHEST PE W OR WO CONTRAST  Result Date: 06/16/2020 CLINICAL DATA:  Respiratory failure. Shortness of breath. Recent COVID positive. EXAM: CT ANGIOGRAPHY CHEST WITH CONTRAST TECHNIQUE: Multidetector CT imaging of the chest was performed using the standard protocol during bolus administration of intravenous contrast. Multiplanar CT image  reconstructions and MIPs were obtained to evaluate the vascular anatomy. CONTRAST:  OMNIPAQUE IOHEXOL 350 MG/ML SOLN COMPARISON:  Chest CTA 12 days ago, no interval chest exams available. FINDINGS: Cardiovascular: Breathing motion artifact limits detailed assessment. Allowing for this, no evidence of pulmonary embolus. Mild aortic atherosclerosis. No aortic aneurysm or dissection. The heart is normal in size. No pericardial effusion. Mediastinum/Nodes: No enlarged mediastinal or hilar lymph nodes. No visualized thyroid nodule. Small hiatal hernia. No pneumomediastinum. Lungs/Pleura: Known COVID pneumonia with coalescing ground-glass opacities throughout both lungs, multifocal primarily subpleural opacities persist. There is developing bronchiectasis in the right middle lobe and lingula. No pneumothorax. No pleural fluid. Upper Abdomen: No acute upper abdominal findings. Musculoskeletal: Degenerative change in the thoracic spine. There are no acute or suspicious osseous abnormalities. Review of the MIP images confirms the above findings. IMPRESSION: 1. Breathing motion artifact limits detailed assessment. Allowing for this, no evidence of pulmonary embolus. 2. Known COVID pneumonia with coalescing ground-glass opacities throughout both lungs, multifocal primarily subpleural opacities persist. There is developing bronchiectasis in the right middle lobe and lingula. Aortic Atherosclerosis (ICD10-I70.0). Electronically Signed   By: Narda Rutherford M.D.   On: 06/16/2020 17:20    Review of Systems  A 10 point review of systems was performed and it is as noted above otherwise negative.  Difficult to obtain as the patient dismisses a full review of systems.  Blood pressure (!) 126/91, pulse 93, temperature 98.8 F (37.1 C), temperature source Oral, resp. rate 20, height  (1.702 m), weight 124.7 kg, SpO2 94 %.  Physical Exam GENERAL: Disheveled appearing male, no acute distress.  No conversational  dyspnea.  Comfortable on nasal cannula O2. HEAD: Normocephalic, atraumatic.  EYES: Pupils equal, round, reactive to light.  No scleral icterus.  MOUTH: Oral mucosa moist, poor dentition. NECK: Supple. No thyromegaly. Trachea midline. No JVD.  No adenopathy. PULMONARY: Good air entry bilaterally.  Coarse breath sounds otherwise no adventitious sounds. CARDIOVASCULAR: S1 and S2. Regular rate and rhythm.  ABDOMEN: Nondistended, benign. MUSCULOSKELETAL: No joint deformity, no clubbing, no edema.  NEUROLOGIC: No overt focal deficit.  Appears befuddled at times. SKIN: Intact,warm,dry. PSYCH: Impulsive, poor insight and judgment.  I have reviewed all the laboratory data and the radiographic data independently.  Assessment/Plan:  Synopsis: This is a 54 year old male with a history of obesity and no other significant past medical history, unvaccinated status against Covid who presented on 24 August with Covid pneumonia and acute respiratory failure with hypoxia due to the same.  Patient appears to require 6 L/min with ambulation of O2 and 3 L/min at rest.  We are asked to render opinion as to his O2 requirements.  Per review of the chart it appears that his oxygen requirements have been steadily decreasing.  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  He likely has "Long Covid".  My concern with regards to the patient is that he lives alone and he appears to be currently very impulsive and at times befuddled.  Would be concerned about him  having issues with hypoxia and not being able to seek help.  Recommend case manager evaluation to explore discharge options for the patient.  All of this was discussed with Dr. Irena Cords.  Dwayne Shelter, MD [facility]   *This note was dictated using voice recognition software/Dragon.  Despite best efforts to proofread, errors can occur which can change the meaning.  Any change was purely unintentional.     Sarina Ser 06/17/2020, 3:11 PM

## 2020-06-17 NOTE — Progress Notes (Signed)
PROGRESS NOTE    Dwayne Dalton  YKD:[id number] DOB: [birth year] DOA: 05/30/2020 PCP: Patient, No Pcp Per   Brief Narrative:  Presents with a 1 week history of fever chills cough and shortness of breath as well as chest pain now with associated diarrhea. Chest pain is worse on taking a deep breath. Patient is unvaccinated against Covid. ED Course:On arrival febrile at 102.7, tachycardia at 110, tachypneic at 28 with O2 sats 93% on room air. Covid positive. Chest x-ray consistent with viral pneumonia. Blood work showing mild hyponatremia of 129 with mild AKI, creatinine 1.26 with bicarb of 17. Lactic acid 1.5.Patient was hydrated in the ER. Hospitalist consulted for admission.   Assessment & Plan:   Active Problems:   Pneumonia due to COVID-19 virus   Diarrhea due to COVID-19   AKI (acute kidney injury) (HCC)   Metabolic acidosis   Obesity, Class III, BMI 40-49.9 (morbid obesity) (HCC)   COVID-19 Acute respiratory failure with hypoxia/Covid pneumonia COVID-19 Labs   Recent Labs    06/15/20 0651 06/16/20 0510 06/17/20 0550  FERRITIN 1,982* 1,618* 1,759*  LDH 305* 300* 310*  CRP 1.2* <0.5 <0.5   Lab Results  Component Value Date   SARSCOV2NAA POSITIVE (A) 05/30/2020  SpO2: 94 % O2 Flow Rate (L/min): 3 L/min FiO2 (%): (!) 2 %   -8/24 Remdesivir per pharmacy protocol -8/24 Baricitinib x14-day course per pharmacy protocol.  Per EMR HCPOA okayed use of this experimental medicationThe treatment plan and use of medications and known side effects were discussed with patient/family. It was clearly explained that Complete risks and long-term side effects are unknown, however in the best clinical judgment they seem to be of some clinical benefit rather than medical risks. Patient/family agree with the treatment plan and want to receive these treatments as indicated. -Vitamin C and zinc per Covid protocol -Combivent QID -Flutter valve -Incentive spirometry -Patient  counseled at length that currently his O2 requirements are too high to go without oxygen and that he is going to pass out if he attempts to remove his O2 to try to prove that he is ready for discharge. SATURATION QUALIFICATIONS: (Thisnote is usedto comply with regulatory documentation for home oxygen) Patient Saturations on Room Air at Rest =93% Patient Saturations on [employer] while Ambulating =80% Patient Saturations on8Liters of oxygen while Ambulating = 91% Please briefly explain why patient needs home oxygen: -Patient meets criteria for home O2 -Patient still requiring too much O2 to safely discharge (8 L) -Pt still needs high amount of oxygen prior to safe discharge.  --pt is desatting with ambulation and needs 7L [place] and is not safe for d/c. We will repeat imagine study to identify any PE.  -- Pt seen today he is again clinically improving slowly and ambulation and exertion he does  Need high amount of oxygen.   --we greatly appreciate PCCM consult and care.   Chronic lower extremity edema -Did not appreciate on exam --resolved.   Obesity, morbid (BMI 43.07 kg/m) -Placing the patient at high risk for poor outcomes with COVID-19 -- counseling once pt is stable and upon d/c. --A1c.   Diarrhea. -Florastor. Add Imodium -resolved.   Left lower extremity cellulitis. -Completed course of Keflex.  Developmentally delayed -Easy to reorient -Not sure patient fully understands his situation -Who is his HCPOA?  Goals of care -PT/OT recommend home health -9/8 NCM consider LTAC given that patient's O2 demands when ambulating are too high to safely discharged home -d/w CM pt is to go  home.   DVT prophylaxis: Lovenox. Code Status: FULL Family Communication:None at bedside Disposition Plan:TBD. Subjective: 9/7 afebrile overnight A/O x4, negative abdominal pain, positive S OB.  Patient doing well on room air during exam will obtain ambulatory SPO2 on patient  9/8  afebrile ,Vitals are stable and labs are stable.pt is on HFNC with o2 sats of 93%.  9/9: Pt was alert awake and keen to go home.  D/w him that we will decide if he is appropriate for d/c.   9/10; Pt is awake and oriented and denies any complaints.  Ambulating pulse ox is low and requires about 7 L to bring sat to 90's.   9/11 Pt is awake says he had a BM today after two weeks and feels better.  Pt is cooperative and is on 3L [place] at rest. ALT is stable.  Objective: Vitals:   06/17/20 0447 06/17/20 0448 06/17/20 0807 06/17/20 1229  BP:  (!) 141/87 (!) 129/94 (!) 126/91  Pulse: 99 90 98 93  Resp: 19  20 20   Temp: 97.9 F (36.6 C)  (!) 97.5 F (36.4 C) 98.8 F (37.1 C)  TempSrc: Oral  Oral Oral  SpO2: 93%  93% 94%  Weight:      Height:        Intake/Output Summary (Last 24 hours) at 06/17/2020 1408 Last data filed at 06/17/2020 0400 Gross per 24 hour  Intake --  Output 1050 ml  Net -1050 ml   Filed Weights   05/30/20 2052  Weight: 124.7 kg    Examination: Blood pressure (!) 126/91, pulse 93, temperature 98.8 F (37.1 C), temperature source Oral, resp. rate 20, height 5\' 7"  (1.702 m), weight 124.7 kg, SpO2 94 %. General exam: Appears calm and comfortable  Respiratory system: Clear to auscultation. Respiratory effort normal. Cardiovascular system: S1 & S2 heard, RRR. No JVD, murmurs, rubs, gallops or clicks. No pedal edema. Gastrointestinal system: Abdomen is nondistended, soft and nontender. No organomegaly or masses felt. Normal bowel sounds heard. Central nervous system: Alert and oriented. No focal neurological deficits. Extremities: Symmetric 5 x 5 power. Skin: No rashes, lesions or ulcers Psychiatry: Judgement and insight appear normal. Mood & affect appropriate.   Data Reviewed: I have personally reviewed following labs and imaging studies  CBC: Recent Labs  Lab 06/13/20 0602 06/14/20 0527 06/15/20 0651 06/16/20 0510 06/17/20 0550  WBC 15.0* 14.8* 15.2*  15.6* 16.2*  NEUTROABS 13.7* 13.7* 13.5* 13.8* 14.2*  HGB 15.8 16.8 15.6 15.6 15.2  HCT 45.5 46.0 42.9 41.7 40.7  MCV 85.7 84.1 84.3 81.8 81.1  PLT 455* 378 341 315 328   Basic Metabolic Panel: Recent Labs  Lab 06/13/20 0602 06/14/20 0527 06/15/20 0651 06/16/20 0510 06/17/20 0550  NA 131* 133* 131* 132* 130*  K 4.3 4.0 4.3 4.3 3.6  CL 88* 88* 86* 86* 85*  CO2 29 30 31  33* 31  GLUCOSE 194* 179* 226* 198* 187*  BUN 31* 33* 31* 30* 25*  CREATININE 1.21 1.05 1.05 1.09 1.00  CALCIUM 8.9 8.9 8.9 8.7* 8.6*  MG 2.6* 2.6* 2.5* 2.7* 2.4  PHOS 4.3 4.8* 4.0 4.1 3.5   GFR: Estimated Creatinine Clearance: 109.4 mL/min (by C-G formula based on SCr of 1 mg/dL). Liver Function Tests: Recent Labs  Lab 06/13/20 0602 06/14/20 0527 06/15/20 0651 06/16/20 0510 06/17/20 0550  AST 32 34 34 31 31  ALT 91* 104* 108* 110* 109*  ALKPHOS 67 76 69 69 67  BILITOT 1.1 1.2 1.3* 1.2 0.9  PROT 6.9 6.7 6.3* 6.3* 6.3*  ALBUMIN 3.6 3.6 3.3* 3.3* 3.3*   Anemia Panel: Recent Labs    06/16/20 0510 06/17/20 0550  FERRITIN 1,618* 1,759*   Radiology Studies: CT ANGIO CHEST PE W OR WO CONTRAST  Result Date: 06/16/2020 CLINICAL DATA:  Respiratory failure. Shortness of breath. Recent COVID positive. EXAM: CT ANGIOGRAPHY CHEST WITH CONTRAST TECHNIQUE: Multidetector CT imaging of the chest was performed using the standard protocol during bolus administration of intravenous contrast. Multiplanar CT image reconstructions and MIPs were obtained to evaluate the vascular anatomy. CONTRAST:  OMNIPAQUE IOHEXOL 350 MG/ML SOLN COMPARISON:  Chest CTA 12 days ago, no interval chest exams available. FINDINGS: Cardiovascular: Breathing motion artifact limits detailed assessment. Allowing for this, no evidence of pulmonary embolus. Mild aortic atherosclerosis. No aortic aneurysm or dissection. The heart is normal in size. No pericardial effusion. Mediastinum/Nodes: No enlarged mediastinal or hilar lymph nodes. No  visualized thyroid nodule. Small hiatal hernia. No pneumomediastinum. Lungs/Pleura: Known COVID pneumonia with coalescing ground-glass opacities throughout both lungs, multifocal primarily subpleural opacities persist. There is developing bronchiectasis in the right middle lobe and lingula. No pneumothorax. No pleural fluid. Upper Abdomen: No acute upper abdominal findings. Musculoskeletal: Degenerative change in the thoracic spine. There are no acute or suspicious osseous abnormalities. Review of the MIP images confirms the above findings. IMPRESSION: 1. Breathing motion artifact limits detailed assessment. Allowing for this, no evidence of pulmonary embolus. 2. Known COVID pneumonia with coalescing ground-glass opacities throughout both lungs, multifocal primarily subpleural opacities persist. There is developing bronchiectasis in the right middle lobe and lingula. Aortic Atherosclerosis (ICD10-I70.0). Electronically Signed   By: Narda Rutherford M.D.   On: 06/16/2020 17:20    Scheduled Meds: . vitamin C  500 mg Oral Daily  . baricitinib  4 mg Oral Daily  . enoxaparin (LOVENOX) injection  40 mg Subcutaneous Q12H  . furosemide  20 mg Intravenous BID  . Ipratropium-Albuterol  1 puff Inhalation QID  . methylPREDNISolone (SOLU-MEDROL) injection  60 mg Intravenous Q12H  . saccharomyces boulardii  250 mg Oral BID  . zinc sulfate  220 mg Oral Daily   Continuous Infusions:  LOS: 17 days    Gertha Calkin, MD [facility] Pager [phone] If 7PM-7AM, please contact night-coverage [email or web address] Password [facility] 06/17/2020, 2:08 PM

## 2020-06-17 NOTE — Care Plan (Signed)
Formal consult note in progress.  Patient was seen and examined.  I have conveyed impressions to Dr. Allena Katz via secure chat.  Patient appears to be requiring 3 L of O2 to maintain oxygen saturations at 91%.  He does desaturate with ambulation requiring 7 L to maintain 92 to 93%.  I suspect he would tolerate 6 L.  With the requirement of 3 L at rest and 6 L with ambulation he could be discharged home.  The concern is not so much the O2 flow requirement but that the patient lives alone and may not have support system.  Would recommend case manager evaluation to see if there are options for the patient.  Gailen Shelter, MD [facility]   *This note was dictated using voice recognition software/Dragon.  Despite best efforts to proofread, errors can occur which can change the meaning.  Any change was purely unintentional.

## 2020-06-18 ENCOUNTER — Inpatient Hospital Stay
Admit: 2020-06-18 | Discharge: 2020-06-18 | Disposition: A | Discharge status: Home / Self Care | Payer: Managed Care, Other (non HMO) | Attending: Internal Medicine | Admitting: Internal Medicine

## 2020-06-18 LAB — COMPREHENSIVE METABOLIC PANEL
ALT: 110 U/L — ABNORMAL HIGH (ref 0–44)
AST: 30 U/L (ref 15–41)
Albumin: 3.3 g/dL — ABNORMAL LOW (ref 3.5–5.0)
Alkaline Phosphatase: 66 U/L (ref 38–126)
Anion gap: 13 (ref 5–15)
BUN: 26 mg/dL — ABNORMAL HIGH (ref 6–20)
CO2: 32 mmol/L (ref 22–32)
Calcium: 8.9 mg/dL (ref 8.9–10.3)
Chloride: 86 mmol/L — ABNORMAL LOW (ref 98–111)
Creatinine, Ser: 0.98 mg/dL (ref 0.61–1.24)
GFR calc Af Amer: >60 mL/min (ref >60)
GFR calc non Af Amer: >60 mL/min (ref >60)
Glucose, Bld: 177 mg/dL — ABNORMAL HIGH (ref 70–99)
Potassium: 3.8 mmol/L (ref 3.5–5.1)
Sodium: 131 mmol/L — ABNORMAL LOW (ref 135–145)
Total Bilirubin: 0.9 mg/dL (ref 0.3–1.2)
Total Protein: 6.2 g/dL — ABNORMAL LOW (ref 6.5–8.1)

## 2020-06-18 LAB — PHOSPHORUS: Phosphorus: 3.9 mg/dL (ref 2.5–4.6)

## 2020-06-18 LAB — CBC WITH DIFFERENTIAL/PLATELET
Abs Immature Granulocytes: 0.3 10*3/uL — ABNORMAL HIGH (ref 0.00–0.07)
Basophils Absolute: 0 10*3/uL (ref 0.0–0.1)
Basophils Relative: 0 %
Eosinophils Absolute: 0 10*3/uL (ref 0.0–0.5)
Eosinophils Relative: 0 %
HCT: 40.4 % (ref 39.0–52.0)
Hemoglobin: 14.9 g/dL (ref 13.0–17.0)
Immature Granulocytes: 2 %
Lymphocytes Relative: 7 %
Lymphs Abs: 1.1 10*3/uL (ref 0.7–4.0)
MCH: 30.8 pg (ref 26.0–34.0)
MCHC: 36.9 g/dL — ABNORMAL HIGH (ref 30.0–36.0)
MCV: 83.5 fL (ref 80.0–100.0)
Monocytes Absolute: 0.8 10*3/uL (ref 0.1–1.0)
Monocytes Relative: 5 %
Neutro Abs: 14 10*3/uL — ABNORMAL HIGH (ref 1.7–7.7)
Neutrophils Relative %: 86 %
Platelets: 278 10*3/uL (ref 150–400)
RBC: 4.84 MIL/uL (ref 4.22–5.81)
RDW: 13.2 % (ref 11.5–15.5)
WBC: 16.2 10*3/uL — ABNORMAL HIGH (ref 4.0–10.5)
nRBC: 0 % (ref 0.0–0.2)

## 2020-06-18 LAB — MAGNESIUM: Magnesium: 2.4 mg/dL (ref 1.7–2.4)

## 2020-06-18 LAB — LACTATE DEHYDROGENASE: LDH: 309 U/L — ABNORMAL HIGH (ref 98–192)

## 2020-06-18 LAB — FERRITIN: Ferritin: 1485 ng/mL — ABNORMAL HIGH (ref 24–336)

## 2020-06-18 LAB — C-REACTIVE PROTEIN: CRP: 0.5 mg/dL (ref <1.0)

## 2020-06-18 LAB — FIBRIN DERIVATIVES D-DIMER (ARMC ONLY): Fibrin derivatives D-dimer (ARMC): 298.16 ng/mL (FEU) (ref 0.00–499.00)

## 2020-06-18 NOTE — TOC Initial Note (Signed)
Transition of Care [facility]) - Initial/Assessment Note    Patient Details  Name: Dwayne Dalton MRN: [id number] Date of Birth: 1966/05/11  [facility] of [facility]) CM/SW Contact:    Allayne Butcher, RN Phone Number: 06/18/2020, 3:07 PM  Clinical Narrative:                 Patient admitted to the hospital with COVID 19 requiring supplemental oxygen at 2L via [place] at rest but requires higher level of oxygen with exertion.  Plan is to discharge the patient home tomorrow.  The patient lives alone in [place] in an apartment.  Patient is independent in ADL's.  He does not drive but his brother bought him a 3 wheeled scooter and that is what he uses to go to work and the store.  Patient works at [employer] in [employer].  Patient's father is unsure if the patient has any insurance, none is listed in Epic.  At discharge patient will go to his father's home at [address] [place] [address].  Patient will need charity oxygen and charity home health.  [facility] is on [facility] rotation and accepted home health referral for RN, PT, and OT.  [facility] will be given [facility] referral for oxygen tomorrow.  Patient's father says that putting a card down for the O2 will be no problem.     Expected Discharge Plan: Home w Home Health Services Barriers to Discharge: Inadequate or no insurance, Continued Medical Work up   Patient Goals and CMS Choice Patient states their goals for this hospitalization and ongoing recovery are:: Patient's father is going to be taking him home with him CMS Medicare.gov Compare Post Acute Care list provided to:: Patient Choice offered to / list presented to : Patient  Expected Discharge Plan and Services Expected Discharge Plan: Home w Home Health Services   Discharge Planning Services: CM Consult, Indigent Health Clinic, Medication Assistance Post Acute Care Choice: Home Health Living arrangements for the past 2 months: Apartment                 DME Arranged: Oxygen, Walker  rolling DME Agency: [facility]       HH Arranged: RN, PT, OT HH Agency: [facility] Date HH Agency Contacted: 06/18/20 Time HH Agency Contacted: 1505 Representative spoke with at [facility] Agency: Feliberto Gottron  Prior Living Arrangements/Services Living arrangements for the past 2 months: Apartment Lives with:: Self Patient language and need for interpreter reviewed:: Yes Do you feel safe going back to the place where you live?: Yes      Need for Family Participation in Patient Care: Yes (Comment) (COVID and intelectual disability) Care giver support system in place?: Yes (comment) (father and brother)   Criminal Activity/Legal Involvement Pertinent to Current Situation/Hospitalization: No - Comment as needed  Activities of Daily Living Home Assistive Devices/Equipment: None ADL Screening (condition at time of admission) Patient's cognitive ability adequate to safely complete daily activities?: Yes Is the patient deaf or have difficulty hearing?: No Does the patient have difficulty seeing, even when wearing glasses/contacts?: No Does the patient have difficulty concentrating, remembering, or making decisions?: No Patient able to express need for assistance with ADLs?: Yes Does the patient have difficulty dressing or bathing?: No Independently performs ADLs?: Yes (appropriate for developmental age) Does the patient have difficulty walking or climbing stairs?: No Weakness of Legs: None Weakness of Arms/Hands: None  Permission Sought/Granted Permission sought to share information with : Case Manager, Family Supports, Other (comment)  Permission granted to share information with : Yes, Verbal Permission Granted  Share Information with NAME: D[place]l [place]  Permission granted to share info w AGENCY: [facility]  and [facility]  Permission granted to share info w Relationship: father     Emotional Assessment       Orientation: : Oriented to Self, Oriented to Place,  Oriented to  Time, Oriented to Situation Alcohol / Substance Use: Not Applicable Psych Involvement: No (comment)  Admission diagnosis:  Shortness of breath [R06.02] Pneumonia due to COVID-19 virus [U07.1, J12.82] COVID-19 [U07.1] Patient Active Problem List   Diagnosis Date Noted  . Pneumonia due to COVID-19 virus 05/31/2020  . Diarrhea due to COVID-19 05/31/2020  . AKI (acute kidney injury) (HCC) 05/31/2020  . Metabolic acidosis 05/31/2020  . Obesity, Class III, BMI 40-49.9 (morbid obesity) (HCC) 05/31/2020  . COVID-19 05/31/2020   PCP:  Patient, No Pcp Per Pharmacy:  No Pharmacies Listed    Social Determinants of Health (SDOH) Interventions    Readmission Risk Interventions No flowsheet data found.

## 2020-06-18 NOTE — Progress Notes (Signed)
PROGRESS NOTE    Dwayne Dalton  XHB:[id number] DOB: 1966-07-12 DOA: 05/30/2020 PCP: Patient, No Pcp Per   Brief Narrative:  Presents with a 1 week history of fever chills cough and shortness of breath as well as chest pain now with associated diarrhea. Chest pain is worse on taking a deep breath. Patient is unvaccinated against Covid. ED Course:On arrival febrile at 102.7, tachycardia at 110, tachypneic at 28 with O2 sats 93% on room air. Covid positive. Chest x-ray consistent with viral pneumonia. Blood work showing mild hyponatremia of 129 with mild AKI, creatinine 1.26 with bicarb of 17. Lactic acid 1.5.Patient was hydrated in the ER. Hospitalist consulted for admission.   Assessment & Plan:   Active Problems:   Pneumonia due to COVID-19 virus   Diarrhea due to COVID-19   AKI (acute kidney injury) (HCC)   Metabolic acidosis   Obesity, Class III, BMI 40-49.9 (morbid obesity) (HCC)   COVID-19 Acute respiratory failure with hypoxia/Covid pneumonia COVID-19 Labs   Recent Labs    06/16/20 0510 06/17/20 0550 06/18/20 0656  FERRITIN 1,618* 1,759* 1,485*  LDH 300* 310* 309*  CRP <0.5 <0.5 0.5   Lab Results  Component Value Date   SARSCOV2NAA POSITIVE (A) 05/30/2020  SpO2: 91 % O2 Flow Rate (L/min): 2 L/min FiO2 (%): (!) 2 %   -8/24 Remdesivir per pharmacy protocol -8/24 Baricitinib x14-day course per pharmacy protocol.  Per EMR HCPOA okayed use of this experimental medicationThe treatment plan and use of medications and known side effects were discussed with patient/family. It was clearly explained that Complete risks and long-term side effects are unknown, however in the best clinical judgment they seem to be of some clinical benefit rather than medical risks. Patient/family agree with the treatment plan and want to receive these treatments as indicated. -Vitamin C and zinc per Covid protocol -Combivent QID -Flutter valve -Incentive spirometry -Patient counseled  at length that currently his O2 requirements are too high to go without oxygen and that he is going to pass out if he attempts to remove his O2 to try to prove that he is ready for discharge. SATURATION QUALIFICATIONS: (Thisnote is usedto comply with regulatory documentation for home oxygen) Patient Saturations on Room Air at Rest =93% Patient Saturations on [employer] while Ambulating =80% Patient Saturations on8Liters of oxygen while Ambulating = 91% Please briefly explain why patient needs home oxygen: -Patient meets criteria for home O2 -Patient still requiring too much O2 to safely discharge (8 L) -Pt still needs high amount of oxygen prior to safe discharge.  --pt is desatting with ambulation and needs 7L [place] and is not safe for d/c. We will repeat imagine study to identify any PE.  -- Pt seen today he is again clinically improving slowly and ambulation and exertion he does  Need high amount of oxygen.   --we greatly appreciate PCCM consult and care.  --9/12 PT is A/A and is unhappy and wants to go home.Called his dad and spoke to him about his oxygen need and needing care at home which I am not sure he can do right now. Dad agrees and says he will take him and keep him with him for the next few days and manage. D/w him about oxygen to be available tomorrow ad we will d/c him tomorrow.   Chronic lower extremity edema -Did not appreciate on exam --resolved.   Obesity, morbid (BMI 43.07 kg/m) -Placing the patient at high risk for poor outcomes with COVID-19 -- counseling once pt  is stable and upon d/c. --A1c.   Diarrhea. -Florastor. Add Imodium -resolved.   Left lower extremity cellulitis. -Completed course of Keflex.  Developmentally delayed -Easy to reorient -Not sure patient fully understands his situation -Who is his HCPOA?  Goals of care -PT/OT recommend home health -9/8 NCM consider LTAC given that patient's O2 demands when ambulating are too high to  safely discharged home -d/w CM pt is to go home.   DVT prophylaxis: Lovenox. Code Status: FULL Family Communication:None at bedside Disposition Plan:TBD. Subjective: 9/7 afebrile overnight A/O x4, negative abdominal pain, positive S OB.  Patient doing well on room air during exam will obtain ambulatory SPO2 on patient  9/8 afebrile ,Vitals are stable and labs are stable.pt is on HFNC with o2 sats of 93%.  9/9: Pt was alert awake and keen to go home.  D/w him that we will decide if he is appropriate for d/c.   9/10; Pt is awake and oriented and denies any complaints.  Ambulating pulse ox is low and requires about 7 L to bring sat to 90's.   9/11 Pt is awake says he had a BM today after two weeks and feels better.  Pt is cooperative and is on 3L [place] at rest. ALT is stable. 9/12 Pt is stable, afebrile and pulse ox shows 6L with ambulation and appreciate PCCM consult   Objective: Vitals:   06/18/20 0034 06/18/20 0834 06/18/20 0843 06/18/20 1122  BP: 114/67 (!) 147/95  (!) 135/93  Pulse: 84 (!) 104  100  Resp: 18 20 20 20   Temp: 98.2 F (36.8 C) 98 F (36.7 C)  98.2 F (36.8 C)  TempSrc: Oral Oral  Oral  SpO2: 94% 93%  91%  Weight:      Height:        Intake/Output Summary (Last 24 hours) at 06/18/2020 1608 Last data filed at 06/17/2020 1700 Gross per 24 hour  Intake --  Output 400 ml  Net -400 ml   Filed Weights   05/30/20 2052  Weight: 124.7 kg    Examination: Blood pressure (!) 135/93, pulse 100, temperature 98.2 F (36.8 C), temperature source Oral, resp. rate 20, height 5\' 7"  (1.702 m), weight 124.7 kg, SpO2 91 %. General exam: Appears calm and comfortable  Respiratory system: Clear to auscultation. Respiratory effort normal. Cardiovascular system: S1 & S2 heard, RRR. No JVD, murmurs, rubs, gallops or clicks. No pedal edema. Gastrointestinal system: Abdomen is nondistended, soft and nontender. No organomegaly or masses felt. Normal bowel sounds  heard. Central nervous system: Alert and oriented. No focal neurological deficits. Extremities: Symmetric 5 x 5 power. Skin: No rashes, lesions or ulcers Psychiatry: Judgement and insight appear normal. Mood & affect appropriate.   Data Reviewed: I have personally reviewed following labs and imaging studies  CBC: Recent Labs  Lab 06/14/20 0527 06/15/20 0651 06/16/20 0510 06/17/20 0550 06/18/20 0656  WBC 14.8* 15.2* 15.6* 16.2* 16.2*  NEUTROABS 13.7* 13.5* 13.8* 14.2* 14.0*  HGB 16.8 15.6 15.6 15.2 14.9  HCT 46.0 42.9 41.7 40.7 40.4  MCV 84.1 84.3 81.8 81.1 83.5  PLT 378 341 315 328 278   Basic Metabolic Panel: Recent Labs  Lab 06/14/20 0527 06/15/20 0651 06/16/20 0510 06/17/20 0550 06/18/20 0656  NA 133* 131* 132* 130* 131*  K 4.0 4.3 4.3 3.6 3.8  CL 88* 86* 86* 85* 86*  CO2 30 31 33* 31 32  GLUCOSE 179* 226* 198* 187* 177*  BUN 33* 31* 30* 25* 26*  CREATININE 1.05  1.05 1.09 1.00 0.98  CALCIUM 8.9 8.9 8.7* 8.6* 8.9  MG 2.6* 2.5* 2.7* 2.4 2.4  PHOS 4.8* 4.0 4.1 3.5 3.9   GFR: Estimated Creatinine Clearance: 111.6 mL/min (by C-G formula based on SCr of 0.98 mg/dL). Liver Function Tests: Recent Labs  Lab 06/14/20 0527 06/15/20 0651 06/16/20 0510 06/17/20 0550 06/18/20 0656  AST 34 34 31 31 30   ALT 104* 108* 110* 109* 110*  ALKPHOS 76 69 69 67 66  BILITOT 1.2 1.3* 1.2 0.9 0.9  PROT 6.7 6.3* 6.3* 6.3* 6.2*  ALBUMIN 3.6 3.3* 3.3* 3.3* 3.3*   Anemia Panel: Recent Labs    06/17/20 0550 06/18/20 0656  FERRITIN 1,759* 1,485*   Radiology Studies: CT ANGIO CHEST PE W OR WO CONTRAST  Result Date: 06/16/2020 CLINICAL DATA:  Respiratory failure. Shortness of breath. Recent COVID positive. EXAM: CT ANGIOGRAPHY CHEST WITH CONTRAST TECHNIQUE: Multidetector CT imaging of the chest was performed using the standard protocol during bolus administration of intravenous contrast. Multiplanar CT image reconstructions and MIPs were obtained to evaluate the vascular anatomy.  CONTRAST:  08/16/2020 OMNIPAQUE IOHEXOL 350 MG/ML SOLN COMPARISON:  Chest CTA 12 days ago, no interval chest exams available. FINDINGS: Cardiovascular: Breathing motion artifact limits detailed assessment. Allowing for this, no evidence of pulmonary embolus. Mild aortic atherosclerosis. No aortic aneurysm or dissection. The heart is normal in size. No pericardial effusion. Mediastinum/Nodes: No enlarged mediastinal or hilar lymph nodes. No visualized thyroid nodule. Small hiatal hernia. No pneumomediastinum. Lungs/Pleura: Known COVID pneumonia with coalescing ground-glass opacities throughout both lungs, multifocal primarily subpleural opacities persist. There is developing bronchiectasis in the right middle lobe and lingula. No pneumothorax. No pleural fluid. Upper Abdomen: No acute upper abdominal findings. Musculoskeletal: Degenerative change in the thoracic spine. There are no acute or suspicious osseous abnormalities. Review of the MIP images confirms the above findings. IMPRESSION: 1. Breathing motion artifact limits detailed assessment. Allowing for this, no evidence of pulmonary embolus. 2. Known COVID pneumonia with coalescing ground-glass opacities throughout both lungs, multifocal primarily subpleural opacities persist. There is developing bronchiectasis in the right middle lobe and lingula. Aortic Atherosclerosis (ICD10-I70.0). Electronically Signed   By: M.D.   On: 06/16/2020 17:20    Scheduled Meds: . vitamin C  500 mg Oral Daily  . enoxaparin (LOVENOX) injection  40 mg Subcutaneous Q12H  . furosemide  20 mg Intravenous BID  . Ipratropium-Albuterol  1 puff Inhalation QID  . methylPREDNISolone (SOLU-MEDROL) injection  60 mg Intravenous Q12H  . saccharomyces boulardii  250 mg Oral BID  . zinc sulfate  220 mg Oral Daily   Continuous Infusions:  LOS: 18 days    08/16/2020, MD [facility] Pager [phone] If 7PM-7AM, please contact  night-coverage [email or web address] Password [email or web address] 06/18/2020, 4:08 PM

## 2020-06-18 NOTE — Progress Notes (Signed)
*  PRELIMINARY RESULTS* Echocardiogram 2D Echocardiogram has been performed.  Dwayne Dalton 06/18/2020, 12:39 PM

## 2020-06-19 LAB — ECHOCARDIOGRAM COMPLETE
AR max vel: 1.26 cm2
AV Peak grad: 11 mmHg
Ao pk vel: 1.66 m/s
Area-P 1/2: 5.54 cm2
Height: 67 in
S' Lateral: 2.92 cm
Weight: 4400 oz

## 2020-06-19 LAB — CBC WITH DIFFERENTIAL/PLATELET
Abs Immature Granulocytes: 0.28 10*3/uL — ABNORMAL HIGH (ref 0.00–0.07)
Basophils Absolute: 0 10*3/uL (ref 0.0–0.1)
Basophils Relative: 0 %
Eosinophils Absolute: 0 10*3/uL (ref 0.0–0.5)
Eosinophils Relative: 0 %
HCT: 40.5 % (ref 39.0–52.0)
Hemoglobin: 14.9 g/dL (ref 13.0–17.0)
Immature Granulocytes: 2 %
Lymphocytes Relative: 8 %
Lymphs Abs: 1.3 10*3/uL (ref 0.7–4.0)
MCH: 30.4 pg (ref 26.0–34.0)
MCHC: 36.8 g/dL — ABNORMAL HIGH (ref 30.0–36.0)
MCV: 82.7 fL (ref 80.0–100.0)
Monocytes Absolute: 0.9 10*3/uL (ref 0.1–1.0)
Monocytes Relative: 6 %
Neutro Abs: 13 10*3/uL — ABNORMAL HIGH (ref 1.7–7.7)
Neutrophils Relative %: 84 %
Platelets: 279 10*3/uL (ref 150–400)
RBC: 4.9 MIL/uL (ref 4.22–5.81)
RDW: 13.2 % (ref 11.5–15.5)
WBC: 15.5 10*3/uL — ABNORMAL HIGH (ref 4.0–10.5)
nRBC: 0 % (ref 0.0–0.2)

## 2020-06-19 LAB — FERRITIN: Ferritin: 1403 ng/mL — ABNORMAL HIGH (ref 24–336)

## 2020-06-19 LAB — C-REACTIVE PROTEIN: CRP: 0.6 mg/dL (ref <1.0)

## 2020-06-19 LAB — COMPREHENSIVE METABOLIC PANEL
ALT: 107 U/L — ABNORMAL HIGH (ref 0–44)
AST: 28 U/L (ref 15–41)
Albumin: 3.3 g/dL — ABNORMAL LOW (ref 3.5–5.0)
Alkaline Phosphatase: 67 U/L (ref 38–126)
Anion gap: 14 (ref 5–15)
BUN: 25 mg/dL — ABNORMAL HIGH (ref 6–20)
CO2: 32 mmol/L (ref 22–32)
Calcium: 8.7 mg/dL — ABNORMAL LOW (ref 8.9–10.3)
Chloride: 86 mmol/L — ABNORMAL LOW (ref 98–111)
Creatinine, Ser: 1.03 mg/dL (ref 0.61–1.24)
GFR calc Af Amer: >60 mL/min (ref >60)
GFR calc non Af Amer: >60 mL/min (ref >60)
Glucose, Bld: 169 mg/dL — ABNORMAL HIGH (ref 70–99)
Potassium: 3.3 mmol/L — ABNORMAL LOW (ref 3.5–5.1)
Sodium: 132 mmol/L — ABNORMAL LOW (ref 135–145)
Total Bilirubin: 1 mg/dL (ref 0.3–1.2)
Total Protein: 6.2 g/dL — ABNORMAL LOW (ref 6.5–8.1)

## 2020-06-19 LAB — PHOSPHORUS: Phosphorus: 4 mg/dL (ref 2.5–4.6)

## 2020-06-19 LAB — MAGNESIUM: Magnesium: 2.5 mg/dL — ABNORMAL HIGH (ref 1.7–2.4)

## 2020-06-19 LAB — LACTATE DEHYDROGENASE: LDH: 295 U/L — ABNORMAL HIGH (ref 98–192)

## 2020-06-19 LAB — FIBRIN DERIVATIVES D-DIMER (ARMC ONLY): Fibrin derivatives D-dimer (ARMC): 285.32 ng/mL (FEU) (ref 0.00–499.00)

## 2020-06-19 MED ORDER — ZINC SULFATE 220 (50 ZN) MG PO CAPS: 220.0000 mg | ORAL_CAPSULE | Freq: Every day | ORAL | 0 refills | Status: AC

## 2020-06-19 MED ORDER — DEXAMETHASONE 6 MG PO TABS: 6.0000 mg | ORAL_TABLET | Freq: Every day | ORAL | 0 refills | Status: AC

## 2020-06-19 MED ORDER — ALBUTEROL SULFATE HFA 108 (90 BASE) MCG/ACT IN AERS: 2.0000 | INHALATION_SPRAY | Freq: Four times a day (QID) | RESPIRATORY_TRACT | 2 refills | Status: DC | PRN

## 2020-06-19 MED ORDER — ASCORBIC ACID 500 MG PO TABS: 500.0000 mg | ORAL_TABLET | Freq: Every day | ORAL | 0 refills | Status: AC

## 2020-06-19 MED ORDER — IPRATROPIUM-ALBUTEROL 20-100 MCG/ACT IN AERS: 1.0000 | INHALATION_SPRAY | Freq: Four times a day (QID) | RESPIRATORY_TRACT | 0 refills | Status: DC

## 2020-06-19 NOTE — TOC Transition Note (Addendum)
Transition of Care [facility]) - CM/SW Discharge Note   Patient Details  Name: Dwayne Dalton MRN: [id number] Date of Birth: 1966/04/13  [facility] CM/SW Contact:  Allayne Butcher, RN Phone Number: 06/19/2020, 3:45 PM   Clinical Narrative:     Patient is medically cleared for discharge home with home health services through [facility].  Patient is also going home on oxygen and with a walker provided by [employer].  Oxygen and walker are being delivered now and a concentrator will be delivered to the home.  Patient is going home with his father.  Father will get in touch with [facility] to schedule for PCP and hospital follow up.  Dr. Waymon Amato has agreeed to write orders for a short period of time.   Final next level of care: Home w Home Health Services Barriers to Discharge: Barriers Resolved   Patient Goals and CMS Choice Patient states their goals for this hospitalization and ongoing recovery are:: Patient's father is going to be taking him home with him CMS Medicare.gov Compare Post Acute Care list provided to:: Patient Choice offered to / list presented to : Patient  Discharge Placement                       Discharge Plan and Services   Discharge Planning Services: CM Consult, Indigent Health Clinic, Medication Assistance Post Acute Care Choice: Home Health          DME Arranged: Oxygen, Walker rolling DME Agency: [employer] Date DME Agency Contacted: 06/19/20 Time DME Agency Contacted: 1200 Representative spoke with at DME Agency: Glennon Mac HH Arranged: RN, PT, OT [facility] Agency: [facility] Date HH Agency Contacted: 06/19/20 Time HH Agency Contacted: 1545 Representative spoke with at [facility] Agency: Feliberto Gottron  Social Determinants of Health (SDOH) Interventions     Readmission Risk Interventions No flowsheet data found.

## 2020-06-19 NOTE — Progress Notes (Signed)
SATURATION QUALIFICATIONS: (This note is used to comply with regulatory documentation for home oxygen)  Patient Saturations on Room Air at Rest = 93%  Patient Saturations on Room Air while Ambulating = 83%  Patient Saturations on 2 Liters of oxygen while Ambulating = 92%  Pt has limited ambulation because of oxygen dropping on room air.

## 2020-06-19 NOTE — Discharge Summary (Addendum)
Physician Discharge Summary  Dwayne PippinJeffrey Prohaska WUJ:[id number]:[id number] DOB: 1966-04-24 DOA: 05/30/2020  PCP: Patient, No Pcp Per  Admit date: 05/30/2020 Discharge date: 06/19/2020   Discharge Diagnoses:  Active Problems:   Pneumonia due to COVID-19 virus   Diarrhea due to COVID-19   AKI (acute kidney injury) (HCC)   Metabolic acidosis   Obesity, Class III, BMI 40-49.9 (morbid obesity) (HCC)   COVID-19   Discharge Condition:  Stable on 2 L oxygen while ambulating and activity.  Diet recommendation:  Heart Healthy.  Filed Weights   05/30/20 2052  Weight: 124.7 kg    Discharge activity: As tolerated only.  History of present illness:  Dwayne Dalton is a 54 y.o. male with no significant medical history who presents with a 1 week history of fever chills cough and shortness of breath as well as chest pain now with associated diarrhea.  Chest pain is worse on taking a deep breath.  Patient is unvaccinated against Covid.  On arrival febrile at 102.7, tachycardia at 110, tachypneic at 28 with O2 sats 93% on room air.  Covid positive.  Chest x-ray consistent with viral pneumonia.  Blood work showing mild hyponatremia of 129 with mild AKI, creatinine 1.26 with bicarb of 17. Lactic acid 1.5.  Patient was hydrated in the ER.  Hospitalist consulted for admission.  Pt lives alone and works at [employer] services and has elderly parents.  He also manages his own finances and has a brother. Pt is unvaccinated.   Hospital Course:  Pt's hospital stay has been long but he has recovered well , pt has hypoxia that was not abel to wean for past two weeks, For past to weeks pt has been stable and lasb have remained stable pt was admitted with covid-19 pna. Pt did complete his echocardiogram: Pt was tried to wean off oxygen multiple times , and about last week he started doing well on nasal cannula. Pt received therapy with Remdesivir and Baricitinab. And has done well with his covid-19 pna. Due to his ongoing oxygen  dependence and difficulty  with weaning, PCCM was consulted and since then his o2 requirement has been going down to currently 2L with ambulation.  Spoke to father yesterday who stated that he is going to take hm home with him to help him.   Procedures:  06/18/20 2 D Echo: 1. Left ventricular ejection fraction, by estimation, is 60 to 65%. The left ventricle has normal function. The left ventricle has no regional wall motion abnormalities. Left ventricular diastolic parameters are consistent with Grade I diastolic dysfunction (impaired relaxation). 2. Right ventricular systolic function is normal. The right ventricular size is normal. 3. The mitral valve is normal in structure. Trivial mitral valve regurgitation. 4. The aortic valve is normal in structure. Aortic valve regurgitation is not visualized.  Consultations: PCCM: Dr.carmen gonzalez.   Discharge Exam: Vitals:   06/19/20 0752 06/19/20 1140  BP: (!) 117/91   Pulse: 78   Resp: 18   Temp: 98.1 F (36.7 C)   SpO2: 93% (!) 81%   Physical Exam Vitals and nursing note reviewed.  HENT:     Head: Normocephalic and atraumatic.     Right Ear: External ear normal.     Left Ear: External ear normal.     Nose: Nose normal.     Mouth/Throat:     Pharynx: Oropharynx is clear.  Eyes:     Extraocular Movements: Extraocular movements intact.  Cardiovascular:     Rate and Rhythm: Normal rate  and regular rhythm.  Pulmonary:     Effort: Pulmonary effort is normal.     Breath sounds: Normal breath sounds.  Abdominal:     General: Bowel sounds are normal.     Palpations: Abdomen is soft.  Skin:    General: Skin is warm.  Neurological:     General: No focal deficit present.     Mental Status: He is alert and oriented to person, place, and time.  Psychiatric:        Mood and Affect: Mood normal.    Discharge Instructions   Discharge Instructions    Call MD for:  difficulty breathing, headache or visual disturbances   Complete  by: As directed    Call MD for:  extreme fatigue   Complete by: As directed    Call MD for:  hives   Complete by: As directed    Call MD for:  persistant dizziness or light-headedness   Complete by: As directed    Call MD for:  persistant nausea and vomiting   Complete by: As directed    Call MD for:  severe uncontrolled pain   Complete by: As directed    Call MD for:  temperature >100.4   Complete by: As directed    Diet - low sodium heart healthy   Complete by: As directed    Discharge instructions   Complete by: As directed    Please follow all instructions carefully and cautiously. Please keep all followup appt.   Increase activity slowly   Complete by: As directed    No wound care   Complete by: As directed    Other Restrictions   Complete by: As directed    Please use oxygen during ambulation and follow up with primary care doctor to decide how long you need oxygen therapy.     Allergies as of 06/19/2020   No Known Allergies     Medication List    TAKE these medications   albuterol 108 (90 Base) MCG/ACT inhaler Commonly known as: VENTOLIN HFA Inhale 2 puffs into the lungs every 6 (six) hours as needed for wheezing or shortness of breath.   ascorbic acid 500 MG tablet Commonly known as: VITAMIN C Take 1 tablet (500 mg total) by mouth daily for 7 days. Start taking on: June 20, 2020   dexamethasone 6 MG tablet Commonly known as: DECADRON Take 1 tablet (6 mg total) by mouth daily for 7 days.   zinc sulfate 220 (50 Zn) MG capsule Take 1 capsule (220 mg total) by mouth daily for 7 days. Start taking on: June 20, 2020            Durable Medical Equipment  (From admission, onward)         Start     Ordered   06/19/20 1252  DME Oxygen  Once       Question Answer Comment  Length of Need 6 Months   Mode or (Route) Mask   Liters per Minute 2   Oxygen conserving device Yes   Oxygen delivery system Gas      06/19/20 1304   06/18/20 1459  For  home use only DME Walker rolling  Once       Question Answer Comment  Walker: With 5 Inch Wheels   Patient needs a walker to treat with the following condition COVID-19   Patient needs a walker to treat with the following condition Weakness      06/18/20 1459  No Known Allergies    The results of significant diagnostics from this hospitalization (including imaging, microbiology, ancillary and laboratory) are listed below for reference.    Significant Diagnostic Studies: DG Chest 2 View  Result Date: 05/30/2020 CLINICAL DATA:  54 year old male with shortness of breath. EXAM: CHEST - 2 VIEW COMPARISON:  None. FINDINGS: Bilateral confluent densities primarily involving the mid to lower lung fields and peripheral and subpleural lung most consistent with multifocal pneumonia. Clinical correlation and follow-up recommended. There is no pleural effusion pneumothorax. The cardiac silhouette is within limits. No acute osseous pathology. IMPRESSION: Multifocal pneumonia. Clinical correlation and follow-up recommended. Electronically Signed   By: Elgie Collard M.D.   On: 05/30/2020 21:23   CT ANGIO CHEST PE W OR WO CONTRAST  Result Date: 06/16/2020 CLINICAL DATA:  Respiratory failure. Shortness of breath. Recent COVID positive. EXAM: CT ANGIOGRAPHY CHEST WITH CONTRAST TECHNIQUE: Multidetector CT imaging of the chest was performed using the standard protocol during bolus administration of intravenous contrast. Multiplanar CT image reconstructions and MIPs were obtained to evaluate the vascular anatomy. CONTRAST:  OMNIPAQUE IOHEXOL 350 MG/ML SOLN COMPARISON:  Chest CTA 12 days ago, no interval chest exams available. FINDINGS: Cardiovascular: Breathing motion artifact limits detailed assessment. Allowing for this, no evidence of pulmonary embolus. Mild aortic atherosclerosis. No aortic aneurysm or dissection. The heart is normal in size. No pericardial effusion. Mediastinum/Nodes: No  enlarged mediastinal or hilar lymph nodes. No visualized thyroid nodule. Small hiatal hernia. No pneumomediastinum. Lungs/Pleura: Known COVID pneumonia with coalescing ground-glass opacities throughout both lungs, multifocal primarily subpleural opacities persist. There is developing bronchiectasis in the right middle lobe and lingula. No pneumothorax. No pleural fluid. Upper Abdomen: No acute upper abdominal findings. Musculoskeletal: Degenerative change in the thoracic spine. There are no acute or suspicious osseous abnormalities. Review of the MIP images confirms the above findings. IMPRESSION: 1. Breathing motion artifact limits detailed assessment. Allowing for this, no evidence of pulmonary embolus. 2. Known COVID pneumonia with coalescing ground-glass opacities throughout both lungs, multifocal primarily subpleural opacities persist. There is developing bronchiectasis in the right middle lobe and lingula. Aortic Atherosclerosis (ICD10-I70.0). Electronically Signed   By: Narda Rutherford M.D.   On: 06/16/2020 17:20   CT ANGIO CHEST PE W OR WO CONTRAST  Result Date: 06/05/2020 CLINICAL DATA:  COVID-19, shortness of breath, suspected pulmonary embolism, diarrhea EXAM: CT ANGIOGRAPHY CHEST CT ABDOMEN AND PELVIS WITH CONTRAST TECHNIQUE: Multidetector CT imaging of the chest was performed using the standard protocol during bolus administration of intravenous contrast. Multiplanar CT image reconstructions and MIPs were obtained to evaluate the vascular anatomy. Multidetector CT imaging of the abdomen and pelvis was performed using the standard protocol during bolus administration of intravenous contrast. CONTRAST:  38mL OMNIPAQUE IOHEXOL 350 MG/ML SOLN IV. No oral contrast. COMPARISON:  None FINDINGS: CTA CHEST FINDINGS Cardiovascular: Mild atherosclerotic calcification aorta. Aorta normal caliber without aneurysm or dissection. Heart unremarkable. No pericardial effusion. Pulmonary arteries adequately  opacified. Scattered respiratory motion artifacts. No definite pulmonary emboli. Mediastinum/Nodes: Esophagus unremarkable. No thoracic adenopathy. Base of cervical region normal appearance. Lungs/Pleura: Extensive BILATERAL airspace infiltrates consistent with multifocal pneumonia and COVID-19. No pleural effusion or pneumothorax. No dominant pulmonary mass. Musculoskeletal: Scattered endplate spur formation thoracic spine. Review of the MIP images confirms the above findings. CT ABDOMEN and PELVIS FINDINGS Hepatobiliary: Mild fatty infiltration of liver. Gallbladder and liver otherwise normal appearance. Pancreas: Normal appearance Spleen: Normal appearance Adrenals/Urinary Tract: Adrenal glands and RIGHT kidney normal appearance. Mild atrophy and minimal scarring LEFT  kidney. No renal masses or hydronephrosis. No urinary tract calcification or dilatation. Bladder unremarkable. Stomach/Bowel: Fluid in colon and rectum consistent with history of diarrhea. Slight gaseous distention of transverse colon, nonspecific. No bowel wall thickening or evidence of obstruction. Stomach unremarkable. Vascular/Lymphatic: Aorta normal caliber.  No adenopathy. Reproductive: Unremarkable prostate gland and seminal vesicles Other: Small RIGHT inguinal hernia containing fat. No free air or free fluid. No additional inflammatory process. Musculoskeletal: Degenerative disc disease changes lumbar spine. Review of the MIP images confirms the above findings. IMPRESSION: No evidence of pulmonary embolism. Extensive BILATERAL airspace infiltrates consistent with multifocal pneumonia and COVID-19. Small amount of fluid within the colon and rectum consistent with history of diarrhea. No acute intra-abdominal or intrapelvic abnormalities. Small RIGHT inguinal hernia containing fat. Aortic Atherosclerosis (ICD10-I70.0). Electronically Signed   By: Ulyses Southward M.D.   On: 06/05/2020 10:32   CT ABDOMEN PELVIS W CONTRAST  Result Date:  06/05/2020 CLINICAL DATA:  COVID-19, shortness of breath, suspected pulmonary embolism, diarrhea EXAM: CT ANGIOGRAPHY CHEST CT ABDOMEN AND PELVIS WITH CONTRAST TECHNIQUE: Multidetector CT imaging of the chest was performed using the standard protocol during bolus administration of intravenous contrast. Multiplanar CT image reconstructions and MIPs were obtained to evaluate the vascular anatomy. Multidetector CT imaging of the abdomen and pelvis was performed using the standard protocol during bolus administration of intravenous contrast. CONTRAST:  8mL OMNIPAQUE IOHEXOL 350 MG/ML SOLN IV. No oral contrast. COMPARISON:  None FINDINGS: CTA CHEST FINDINGS Cardiovascular: Mild atherosclerotic calcification aorta. Aorta normal caliber without aneurysm or dissection. Heart unremarkable. No pericardial effusion. Pulmonary arteries adequately opacified. Scattered respiratory motion artifacts. No definite pulmonary emboli. Mediastinum/Nodes: Esophagus unremarkable. No thoracic adenopathy. Base of cervical region normal appearance. Lungs/Pleura: Extensive BILATERAL airspace infiltrates consistent with multifocal pneumonia and COVID-19. No pleural effusion or pneumothorax. No dominant pulmonary mass. Musculoskeletal: Scattered endplate spur formation thoracic spine. Review of the MIP images confirms the above findings. CT ABDOMEN and PELVIS FINDINGS Hepatobiliary: Mild fatty infiltration of liver. Gallbladder and liver otherwise normal appearance. Pancreas: Normal appearance Spleen: Normal appearance Adrenals/Urinary Tract: Adrenal glands and RIGHT kidney normal appearance. Mild atrophy and minimal scarring LEFT kidney. No renal masses or hydronephrosis. No urinary tract calcification or dilatation. Bladder unremarkable. Stomach/Bowel: Fluid in colon and rectum consistent with history of diarrhea. Slight gaseous distention of transverse colon, nonspecific. No bowel wall thickening or evidence of obstruction. Stomach  unremarkable. Vascular/Lymphatic: Aorta normal caliber.  No adenopathy. Reproductive: Unremarkable prostate gland and seminal vesicles Other: Small RIGHT inguinal hernia containing fat. No free air or free fluid. No additional inflammatory process. Musculoskeletal: Degenerative disc disease changes lumbar spine. Review of the MIP images confirms the above findings. IMPRESSION: No evidence of pulmonary embolism. Extensive BILATERAL airspace infiltrates consistent with multifocal pneumonia and COVID-19. Small amount of fluid within the colon and rectum consistent with history of diarrhea. No acute intra-abdominal or intrapelvic abnormalities. Small RIGHT inguinal hernia containing fat. Aortic Atherosclerosis (ICD10-I70.0). Electronically Signed   By: Ulyses Southward M.D.   On: 06/05/2020 10:32   DG Abd Portable 1V  Result Date: 06/02/2020 CLINICAL DATA:  Diarrhea, history of COVID-19 positivity EXAM: PORTABLE ABDOMEN - 1 VIEW COMPARISON:  None. FINDINGS: Scattered large and small bowel gas is noted. No obstructive changes are seen. No free air is noted. Degenerative changes of lumbar spine are noted. No abnormal calcifications are seen. IMPRESSION: No acute abnormality noted. Electronically Signed   By: Alcide Clever M.D.   On: 06/02/2020 21:44   ECHOCARDIOGRAM COMPLETE  Result Date:  06/19/2020    ECHOCARDIOGRAM REPORT   Patient Name:   BARAN KUHRT Date of Exam: 06/18/2020 Medical Rec #:  [id number]      Height:       67.0 in Accession #:    [id number]     Weight:       275.0 lb Date of Birth:  1966-03-16      BSA:          2.315 m Patient Age:    54 years       BP:           114/67 mmHg Patient Gender: M              HR:           90 bpm. Exam Location:  [facility] Procedure: 2D Echo Indications:     Sob  History:         Patient has no prior history of Echocardiogram examinations.  Sonographer:     Wonda Cerise RDCS Referring Phys:  [id number] Kyl Givler V Guinn Delarosa Diagnosing Phys: Alwyn Pea MD  Sonographer Comments:  Technically difficult study due to poor echo windows, no apical window and Technically challenging study due to limited acoustic windows. Image acquisition challenging due to respiratory motion. IMPRESSIONS  1. Left ventricular ejection fraction, by estimation, is 60 to 65%. The left ventricle has normal function. The left ventricle has no regional wall motion abnormalities. Left ventricular diastolic parameters are consistent with Grade I diastolic dysfunction (impaired relaxation).  2. Right ventricular systolic function is normal. The right ventricular size is normal.  3. The mitral valve is normal in structure. Trivial mitral valve regurgitation.  4. The aortic valve is normal in structure. Aortic valve regurgitation is not visualized. FINDINGS  Left Ventricle: Left ventricular ejection fraction, by estimation, is 60 to 65%. The left ventricle has normal function. The left ventricle has no regional wall motion abnormalities. The left ventricular internal cavity size was normal in size. There is  no left ventricular hypertrophy. Left ventricular diastolic parameters are consistent with Grade I diastolic dysfunction (impaired relaxation). Right Ventricle: The right ventricular size is normal. No increase in right ventricular wall thickness. Right ventricular systolic function is normal. Left Atrium: Left atrial size was normal in size. Right Atrium: Right atrial size was normal in size. Pericardium: There is no evidence of pericardial effusion. Mitral Valve: The mitral valve is normal in structure. Trivial mitral valve regurgitation. Tricuspid Valve: The tricuspid valve is normal in structure. Tricuspid valve regurgitation is mild. Aortic Valve: The aortic valve is normal in structure. Aortic valve regurgitation is not visualized. Aortic valve peak gradient measures 11.0 mmHg. Pulmonic Valve: The pulmonic valve was normal in structure. Pulmonic valve regurgitation is not visualized. Aorta: The ascending aorta was  not well visualized. IAS/Shunts: No atrial level shunt detected by color flow Doppler.  LEFT VENTRICLE PLAX 2D LVIDd:         4.28 cm LVIDs:         2.92 cm LV PW:         1.04 cm LV IVS:        1.22 cm LVOT diam:     2.00 cm LV SV:         27 LV SV Index:   12 LVOT Area:     3.14 cm  LEFT ATRIUM         Index LA diam:    2.20 cm 0.95 cm/m  AORTIC VALVE  PULMONIC VALVE AV Area (Vmax): 1.26 cm    PV Vmax:       1.11 m/s AV Vmax:        166.00 cm/s PV Peak grad:  4.9 mmHg AV Peak Grad:   11.0 mmHg LVOT Vmax:      66.60 cm/s LVOT Vmean:     44.500 cm/s LVOT VTI:       0.085 m  AORTA Ao Root diam: 2.90 cm Ao Asc diam:  3.40 cm MITRAL VALVE MV Area (PHT): 5.54 cm    SHUNTS MV Decel Time: 137 msec    Systemic VTI:  0.09 m MV E velocity: 56.60 cm/s  Systemic Diam: 2.00 cm MV A velocity: 78.40 cm/s MV E/A ratio:  0.72 Dwayne D Callwood MD Electronically signed by Alwyn Pea MD Signature Date/Time: 06/19/2020/1:19:12 PM    Final     Microbiology: No results found for this or any previous visit (from the past 240 hour(s)).   Labs: Basic Metabolic Panel: Recent Labs  Lab 06/15/20 0651 06/16/20 0510 06/17/20 0550 06/18/20 0656 06/19/20 0557  NA 131* 132* 130* 131* 132*  K 4.3 4.3 3.6 3.8 3.3*  CL 86* 86* 85* 86* 86*  CO2 31 33* 31 32 32  GLUCOSE 226* 198* 187* 177* 169*  BUN 31* 30* 25* 26* 25*  CREATININE 1.05 1.09 1.00 0.98 1.03  CALCIUM 8.9 8.7* 8.6* 8.9 8.7*  MG 2.5* 2.7* 2.4 2.4 2.5*  PHOS 4.0 4.1 3.5 3.9 4.0   Liver Function Tests: Recent Labs  Lab 06/15/20 0651 06/16/20 0510 06/17/20 0550 06/18/20 0656 06/19/20 0557  AST 34 31 31 30 28   ALT 108* 110* 109* 110* 107*  ALKPHOS 69 69 67 66 67  BILITOT 1.3* 1.2 0.9 0.9 1.0  PROT 6.3* 6.3* 6.3* 6.2* 6.2*  ALBUMIN 3.3* 3.3* 3.3* 3.3* 3.3*   No results for input(s): LIPASE, AMYLASE in the last 168 hours. No results for input(s): AMMONIA in the last 168 hours. CBC: Recent Labs  Lab 06/15/20 0651 06/16/20 0510  06/17/20 0550 06/18/20 0656 06/19/20 0557  WBC 15.2* 15.6* 16.2* 16.2* 15.5*  NEUTROABS 13.5* 13.8* 14.2* 14.0* 13.0*  HGB 15.6 15.6 15.2 14.9 14.9  HCT 42.9 41.7 40.7 40.4 40.5  MCV 84.3 81.8 81.1 83.5 82.7  PLT 341 315 328 278 279   Cardiac Enzymes: No results for input(s): CKTOTAL, CKMB, CKMBINDEX, TROPONINI in the last 168 hours. BNP: BNP (last 3 results) Recent Labs    06/15/20 0651  BNP 55.0    ProBNP (last 3 results) No results for input(s): PROBNP in the last 8760 hours.  CBG: No results for input(s): GLUCAP in the last 168 hours.    Time spent: 1:35 pm -      minutes  Signed:  08/15/20 MD.  [facility] 06/19/2020, 2:47 PM

## 2020-06-19 NOTE — Plan of Care (Signed)
  Problem: Education: Goal: Knowledge of risk factors and measures for prevention of condition will improve Outcome: Adequate for Discharge   

## 2020-06-19 NOTE — Progress Notes (Signed)
Physical Therapy Treatment Patient Details Name: Dwayne Dalton MRN: [id number] DOB: July 06, 1966 Today's Date: 06/19/2020    History of Present Illness 54 y.o. male with no significant medical history who presents with fever, chills, cough and shortness of breath as well as chest pain now with associated diarrhea.  Chest pain is worse on taking a deep breath.  Patient is unvaccinated against Covid. On arrival febrile at 102.7, tachycardia at 110, tachypneic at 28 with O2 sats 93% on room air.  Covid positive.  Chest x-ray consistent with viral pneumonia.     PT Comments    Pt in bed upon entry, O2 doffed, pt reports he can't stand it and doesn't need it when he's resting, he feels fine. O2 sats @ 87%. Pt AMB to door and back, has sharp increase in dyspnea and  Desaturation to 81%, requires seated rest interval. 2nd gait trial met ended early, as pt becomes increasingly agitated from [place] and mask, impulsivly doffs in gait. Pt educated on medical need for O2, comparing it to medicine. Pt verbalizes understanding, but author concerned that pt may not be independent enough to manage his O2 needs at home at [place].    Follow Up Recommendations  Home health PT;Supervision/Assistance - 24 hour     Equipment Recommendations  Rolling walker with 5" wheels    Recommendations for Other Services       Precautions / Restrictions Precautions Precautions: Fall Restrictions Weight Bearing Restrictions: No    Mobility  Bed Mobility Overal bed mobility: Independent       Supine to sit: Independent Sit to supine: Independent      Transfers Overall transfer level: Needs assistance Equipment used: None Transfers: Sit to/from Stand Sit to Stand: Supervision         General transfer comment: slow and cautious  Ambulation/Gait   Gait Distance (Feet): 38 Feet Assistive device: None Gait Pattern/deviations: Step-to pattern;Shuffle     General Gait Details: inititally on RA as received, has  severe DOE, desat to 81%, asks to sit; attempted again on 3L, but pt becomes anxious and impulsively doffs [place] due to aggravation;   Stairs             Wheelchair Mobility    Modified Rankin (Stroke Patients Only)       Balance Overall balance assessment: Modified Independent (still mildly unsteady compared to baseline)                                          Cognition Arousal/Alertness: Awake/alert Behavior During Therapy: WFL for tasks assessed/performed;Anxious;Impulsive (several instances of sensory intolerance from gait belt, mask, [place], socks) Overall Cognitive Status: Within Functional Limits for tasks assessed                                        Exercises      General Comments        Pertinent Vitals/Pain Pain Assessment: No/denies pain (discomfort of ears and nares from nasal canual)    Home Living                      Prior Function            PT Goals (current goals can now be found in the care plan section) Acute Rehab  PT Goals Patient Stated Goal: go home PT Goal Formulation: Patient unable to participate in goal setting Time For Goal Achievement: 06/18/20 Potential to Achieve Goals: Fair Progress towards PT goals: Not progressing toward goals - comment    Frequency    Min 2X/week      PT Plan Current plan remains appropriate    Co-evaluation              AM-PAC PT "6 Clicks" Mobility   Outcome Measure  Help needed turning from your back to your side while in a flat bed without using bedrails?: None Help needed moving from lying on your back to sitting on the side of a flat bed without using bedrails?: None Help needed moving to and from a bed to a chair (including a wheelchair)?: A Little Help needed standing up from a chair using your arms (e.g., wheelchair or bedside chair)?: A Little Help needed to walk in hospital room?: A Little Help needed climbing 3-5 steps with a railing? :  A Little 6 Click Score: 20    End of Session Equipment Utilized During Treatment: Gait belt;Oxygen Activity Tolerance: Patient limited by pain;Treatment limited secondary to agitation Patient left: with call bell/phone within reach;in bed Nurse Communication: Mobility status PT Visit Diagnosis: Muscle weakness (generalized) (M62.81);Difficulty in walking, not elsewhere classified (R26.2)     Time: 2111-5520 PT Time Calculation (min) (ACUTE ONLY): 19 min  Charges:  $Therapeutic Exercise: 8-22 mins                     12:17 PM, 06/19/20 Etta Grandchild, PT, DPT Physical Therapist - [facility]  [phone] ([place])    [facility] 06/19/2020, 12:12 PM

## 2020-07-13 ENCOUNTER — Other Ambulatory Visit: Payer: Self-pay

## 2020-07-17 ENCOUNTER — Telehealth (INDEPENDENT_AMBULATORY_CARE_PROVIDER_SITE_OTHER): Payer: Managed Care, Other (non HMO) | Admitting: Nurse Practitioner

## 2020-07-17 ENCOUNTER — Encounter: Payer: Self-pay | Admitting: Nurse Practitioner

## 2020-07-17 ENCOUNTER — Other Ambulatory Visit: Payer: Self-pay

## 2020-07-17 ENCOUNTER — Telehealth: Payer: Self-pay

## 2020-07-17 VITALS — Ht 67.0 in | Wt 200.0 lb

## 2020-07-17 DIAGNOSIS — U071 COVID-19: Secondary | ICD-10-CM | POA: Diagnosis not present

## 2020-07-17 DIAGNOSIS — J1282 Pneumonia due to Coronavirus disease 2019: Secondary | ICD-10-CM

## 2020-07-17 DIAGNOSIS — R0609 Other forms of dyspnea: Secondary | ICD-10-CM

## 2020-07-17 DIAGNOSIS — R0600 Dyspnea, unspecified: Secondary | ICD-10-CM

## 2020-07-17 DIAGNOSIS — G988 Other disorders of nervous system: Secondary | ICD-10-CM | POA: Insufficient documentation

## 2020-07-17 DIAGNOSIS — Z6831 Body mass index (BMI) 31.0-31.9, adult: Secondary | ICD-10-CM

## 2020-07-17 DIAGNOSIS — E6609 Other obesity due to excess calories: Secondary | ICD-10-CM

## 2020-07-17 DIAGNOSIS — N179 Acute kidney failure, unspecified: Secondary | ICD-10-CM

## 2020-07-17 MED ORDER — PREDNISONE 10 MG PO TABS: ORAL_TABLET | ORAL | 0 refills | Status: DC

## 2020-07-17 MED ORDER — ALBUTEROL SULFATE (2.5 MG/3ML) 0.083% IN NEBU: 2.5000 mg | INHALATION_SOLUTION | Freq: Four times a day (QID) | RESPIRATORY_TRACT | 1 refills | Status: DC | PRN

## 2020-07-17 NOTE — Patient Instructions (Addendum)
Dwayne Dalton has an appointment at the [facility] on Tuesday 10/19 at 4 PM.  Please begin a prednisone taper where he takes 6 pills today, 6 pills tomorrow, 6 pills the next day and then 5 pills the following day and continue to decrease by 1 pill daily until off.  I have also ordered an albuterol in the form of a nebulizer since he cannot use the inhaler at the house.  The nebulizer is easy for him to use.  If you come into the clinic to pick up his short-term disability papers, the nurse can teach you how to use the nebulizer.   Please make follow-up video visit with me in 3 weeks.  Seek emergency care if you get worse with breathing, oxygen saturations stay below 90%, or Dwayne Dalton develops any chest pain, dizziness or lightheadedness, vomiting, abdominal pain, or new worrisome symptoms.      COVID-19 COVID-19 is a respiratory infection that is caused by a virus called severe acute respiratory syndrome coronavirus 2 (SARS-CoV-2). The disease is also known as coronavirus disease or novel coronavirus. In some people, the virus may not cause any symptoms. In others, it may cause a serious infection. The infection can get worse quickly and can lead to complications, such as:  Pneumonia, or infection of the lungs.  Acute respiratory distress syndrome or ARDS. This is a condition in which fluid build-up in the lungs prevents the lungs from filling with air and passing oxygen into the blood.  Acute respiratory failure. This is a condition in which there is not enough oxygen passing from the lungs to the body or when carbon dioxide is not passing from the lungs out of the body.  Sepsis or septic shock. This is a serious bodily reaction to an infection.  Blood clotting problems.  Secondary infections due to bacteria or fungus.  Organ failure. This is when your body's organs stop working. The virus that causes COVID-19 is contagious. This means that it can spread from person to person through droplets  from coughs and sneezes (respiratory secretions). What are the causes? This illness is caused by a virus. You may catch the virus by:  Breathing in droplets from an infected person. Droplets can be spread by a person breathing, speaking, singing, coughing, or sneezing.  Touching something, like a table or a doorknob, that was exposed to the virus (contaminated) and then touching your mouth, nose, or eyes. What increases the risk? Risk for infection You are more likely to be infected with this virus if you:  Are within 6 feet (2 meters) of a person with COVID-19.  Provide care for or live with a person who is infected with COVID-19.  Spend time in crowded indoor spaces or live in shared housing. Risk for serious illness You are more likely to become seriously ill from the virus if you:  Are 33 years of age or older. The higher your age, the more you are at risk for serious illness.  Live in a nursing home or long-term care facility.  Have cancer.  Have a long-term (chronic) disease such as: ? Chronic lung disease, including chronic obstructive pulmonary disease or asthma. ? A long-term disease that lowers your body's ability to fight infection (immunocompromised). ? Heart disease, including heart failure, a condition in which the arteries that lead to the heart become narrow or blocked (coronary artery disease), a disease which makes the heart muscle thick, weak, or stiff (cardiomyopathy). ? Diabetes. ? Chronic kidney disease. ? Sickle cell  disease, a condition in which red blood cells have an abnormal "sickle" shape. ? Liver disease.  Are obese. What are the signs or symptoms? Symptoms of this condition can range from mild to severe. Symptoms may appear any time from 2 to 14 days after being exposed to the virus. They include:  A fever or chills.  A cough.  Difficulty breathing.  Headaches, body aches, or muscle aches.  Runny or stuffy (congested) nose.  A sore  throat.  New loss of taste or smell. Some people may also have stomach problems, such as nausea, vomiting, or diarrhea. Other people may not have any symptoms of COVID-19. How is this diagnosed? This condition may be diagnosed based on:  Your signs and symptoms, especially if: ? You live in an area with a COVID-19 outbreak. ? You recently traveled to or from an area where the virus is common. ? You provide care for or live with a person who was diagnosed with COVID-19. ? You were exposed to a person who was diagnosed with COVID-19.  A physical exam.  Lab tests, which may include: ? Taking a sample of fluid from the back of your nose and throat (nasopharyngeal fluid), your nose, or your throat using a swab. ? A sample of mucus from your lungs (sputum). ? Blood tests.  Imaging tests, which may include, X-rays, CT scan, or ultrasound. How is this treated? At present, there is no medicine to treat COVID-19. Medicines that treat other diseases are being used on a trial basis to see if they are effective against COVID-19. Your health care provider will talk with you about ways to treat your symptoms. For most people, the infection is mild and can be managed at home with rest, fluids, and over-the-counter medicines. Treatment for a serious infection usually takes places in a hospital intensive care unit (ICU). It may include one or more of the following treatments. These treatments are given until your symptoms improve.  Receiving fluids and medicines through an IV.  Supplemental oxygen. Extra oxygen is given through a tube in the nose, a face mask, or a hood.  Positioning you to lie on your stomach (prone position). This makes it easier for oxygen to get into the lungs.  Continuous positive airway pressure (CPAP) or bi-level positive airway pressure (BPAP) machine. This treatment uses mild air pressure to keep the airways open. A tube that is connected to a motor delivers oxygen to the  body.  Ventilator. This treatment moves air into and out of the lungs by using a tube that is placed in your windpipe.  Tracheostomy. This is a procedure to create a hole in the neck so that a breathing tube can be inserted.  Extracorporeal membrane oxygenation (ECMO). This procedure gives the lungs a chance to recover by taking over the functions of the heart and lungs. It supplies oxygen to the body and removes carbon dioxide. Follow these instructions at home: Lifestyle  If you are sick, stay home except to get medical care. Your health care provider will tell you how long to stay home. Call your health care provider before you go for medical care.  Rest at home as told by your health care provider.  Do not use any products that contain nicotine or tobacco, such as cigarettes, e-cigarettes, and chewing tobacco. If you need help quitting, ask your health care provider.  Return to your normal activities as told by your health care provider. Ask your health care provider what activities are  safe for you. General instructions  Take over-the-counter and prescription medicines only as told by your health care provider.  Drink enough fluid to keep your urine pale yellow.  Keep all follow-up visits as told by your health care provider. This is important. How is this prevented?  There is no vaccine to help prevent COVID-19 infection. However, there are steps you can take to protect yourself and others from this virus. To protect yourself:   Do not travel to areas where COVID-19 is a risk. The areas where COVID-19 is reported change often. To identify high-risk areas and travel restrictions, check the CDC travel website: StageSync.si  If you live in, or must travel to, an area where COVID-19 is a risk, take precautions to avoid infection. ? Stay away from people who are sick. ? Wash your hands often with soap and water for 20 seconds. If soap and water are not available, use  an alcohol-based hand sanitizer. ? Avoid touching your mouth, face, eyes, or nose. ? Avoid going out in public, follow guidance from your state and local health authorities. ? If you must go out in public, wear a cloth face covering or face mask. Make sure your mask covers your nose and mouth. ? Avoid crowded indoor spaces. Stay at least 6 feet (2 meters) away from others. ? Disinfect objects and surfaces that are frequently touched every day. This may include:  Counters and tables.  Doorknobs and light switches.  Sinks and faucets.  Electronics, such as phones, remote controls, keyboards, computers, and tablets. To protect others: If you have symptoms of COVID-19, take steps to prevent the virus from spreading to others.  If you think you have a COVID-19 infection, contact your health care provider right away. Tell your health care team that you think you may have a COVID-19 infection.  Stay home. Leave your house only to seek medical care. Do not use public transport.  Do not travel while you are sick.  Wash your hands often with soap and water for 20 seconds. If soap and water are not available, use alcohol-based hand sanitizer.  Stay away from other members of your household. Let healthy household members care for children and pets, if possible. If you have to care for children or pets, wash your hands often and wear a mask. If possible, stay in your own room, separate from others. Use a different bathroom.  Make sure that all people in your household wash their hands well and often.  Cough or sneeze into a tissue or your sleeve or elbow. Do not cough or sneeze into your hand or into the air.  Wear a cloth face covering or face mask. Make sure your mask covers your nose and mouth. Where to find more information  Centers for Disease Control and Prevention: StickerEmporium.tn  World Health Organization: https://thompson-craig.com/ Contact a  health care provider if:  You live in or have traveled to an area where COVID-19 is a risk and you have symptoms of the infection.  You have had contact with someone who has COVID-19 and you have symptoms of the infection. Get help right away if:  You have trouble breathing.  You have pain or pressure in your chest.  You have confusion.  You have bluish lips and fingernails.  You have difficulty waking from sleep.  You have symptoms that get worse. These symptoms may represent a serious problem that is an emergency. Do not wait to see if the symptoms will go away. Get  medical help right away. Call your local emergency services (911 in the [place] Do not drive yourself to the hospital. Let the emergency medical personnel know if you think you have COVID-19. Summary  COVID-19 is a respiratory infection that is caused by a virus. It is also known as coronavirus disease or novel coronavirus. It can cause serious infections, such as pneumonia, acute respiratory distress syndrome, acute respiratory failure, or sepsis.  The virus that causes COVID-19 is contagious. This means that it can spread from person to person through droplets from breathing, speaking, singing, coughing, or sneezing.  You are more likely to develop a serious illness if you are 30 years of age or older, have a weak immune system, live in a nursing home, or have chronic disease.  There is no medicine to treat COVID-19. Your health care provider will talk with you about ways to treat your symptoms.  Take steps to protect yourself and others from infection. Wash your hands often and disinfect objects and surfaces that are frequently touched every day. Stay away from people who are sick and wear a mask if you are sick. This information is not intended to replace advice given to you by your health care provider. Make sure you discuss any questions you have with your health care provider. Document Revised: 07/23/2019 Document  Reviewed: 10/29/2018 Elsevier Patient Education  2020 [employer]  COVID-19 COVID-19 is a respiratory infection that is caused by a virus called severe acute respiratory syndrome coronavirus 2 (SARS-CoV-2). The disease is also known as coronavirus disease or novel coronavirus. In some people, the virus may not cause any symptoms. In others, it may cause a serious infection. The infection can get worse quickly and can lead to complications, such as:  Pneumonia, or infection of the lungs.  Acute respiratory distress syndrome or ARDS. This is a condition in which fluid build-up in the lungs prevents the lungs from filling with air and passing oxygen into the blood.  Acute respiratory failure. This is a condition in which there is not enough oxygen passing from the lungs to the body or when carbon dioxide is not passing from the lungs out of the body.  Sepsis or septic shock. This is a serious bodily reaction to an infection.  Blood clotting problems.  Secondary infections due to bacteria or fungus.  Organ failure. This is when your body's organs stop working. The virus that causes COVID-19 is contagious. This means that it can spread from person to person through droplets from coughs and sneezes (respiratory secretions). What are the causes? This illness is caused by a virus. You may catch the virus by:  Breathing in droplets from an infected person. Droplets can be spread by a person breathing, speaking, singing, coughing, or sneezing.  Touching something, like a table or a doorknob, that was exposed to the virus (contaminated) and then touching your mouth, nose, or eyes. What increases the risk? Risk for infection You are more likely to be infected with this virus if you:  Are within 6 feet (2 meters) of a person with COVID-19.  Provide care for or live with a person who is infected with COVID-19.  Spend time in crowded indoor spaces or live in shared housing. Risk for serious  illness You are more likely to become seriously ill from the virus if you:  Are 100 years of age or older. The higher your age, the more you are at risk for serious illness.  Live in a nursing home  or long-term care facility.  Have cancer.  Have a long-term (chronic) disease such as: ? Chronic lung disease, including chronic obstructive pulmonary disease or asthma. ? A long-term disease that lowers your body's ability to fight infection (immunocompromised). ? Heart disease, including heart failure, a condition in which the arteries that lead to the heart become narrow or blocked (coronary artery disease), a disease which makes the heart muscle thick, weak, or stiff (cardiomyopathy). ? Diabetes. ? Chronic kidney disease. ? Sickle cell disease, a condition in which red blood cells have an abnormal "sickle" shape. ? Liver disease.  Are obese. What are the signs or symptoms? Symptoms of this condition can range from mild to severe. Symptoms may appear any time from 2 to 14 days after being exposed to the virus. They include:  A fever or chills.  A cough.  Difficulty breathing.  Headaches, body aches, or muscle aches.  Runny or stuffy (congested) nose.  A sore throat.  New loss of taste or smell. Some people may also have stomach problems, such as nausea, vomiting, or diarrhea. Other people may not have any symptoms of COVID-19. How is this diagnosed? This condition may be diagnosed based on:  Your signs and symptoms, especially if: ? You live in an area with a COVID-19 outbreak. ? You recently traveled to or from an area where the virus is common. ? You provide care for or live with a person who was diagnosed with COVID-19. ? You were exposed to a person who was diagnosed with COVID-19.  A physical exam.  Lab tests, which may include: ? Taking a sample of fluid from the back of your nose and throat (nasopharyngeal fluid), your nose, or your throat using a swab. ? A sample  of mucus from your lungs (sputum). ? Blood tests.  Imaging tests, which may include, X-rays, CT scan, or ultrasound. How is this treated? At present, there is no medicine to treat COVID-19. Medicines that treat other diseases are being used on a trial basis to see if they are effective against COVID-19. Your health care provider will talk with you about ways to treat your symptoms. For most people, the infection is mild and can be managed at home with rest, fluids, and over-the-counter medicines. Treatment for a serious infection usually takes places in a hospital intensive care unit (ICU). It may include one or more of the following treatments. These treatments are given until your symptoms improve.  Receiving fluids and medicines through an IV.  Supplemental oxygen. Extra oxygen is given through a tube in the nose, a face mask, or a hood.  Positioning you to lie on your stomach (prone position). This makes it easier for oxygen to get into the lungs.  Continuous positive airway pressure (CPAP) or bi-level positive airway pressure (BPAP) machine. This treatment uses mild air pressure to keep the airways open. A tube that is connected to a motor delivers oxygen to the body.  Ventilator. This treatment moves air into and out of the lungs by using a tube that is placed in your windpipe.  Tracheostomy. This is a procedure to create a hole in the neck so that a breathing tube can be inserted.  Extracorporeal membrane oxygenation (ECMO). This procedure gives the lungs a chance to recover by taking over the functions of the heart and lungs. It supplies oxygen to the body and removes carbon dioxide. Follow these instructions at home: Lifestyle  If you are sick, stay home except to get medical care.  Your health care provider will tell you how long to stay home. Call your health care provider before you go for medical care.  Rest at home as told by your health care provider.  Do not use any  products that contain nicotine or tobacco, such as cigarettes, e-cigarettes, and chewing tobacco. If you need help quitting, ask your health care provider.  Return to your normal activities as told by your health care provider. Ask your health care provider what activities are safe for you. General instructions  Take over-the-counter and prescription medicines only as told by your health care provider.  Drink enough fluid to keep your urine pale yellow.  Keep all follow-up visits as told by your health care provider. This is important. How is this prevented?  There is no vaccine to help prevent COVID-19 infection. However, there are steps you can take to protect yourself and others from this virus. To protect yourself:   Do not travel to areas where COVID-19 is a risk. The areas where COVID-19 is reported change often. To identify high-risk areas and travel restrictions, check the CDC travel website: StageSync.si  If you live in, or must travel to, an area where COVID-19 is a risk, take precautions to avoid infection. ? Stay away from people who are sick. ? Wash your hands often with soap and water for 20 seconds. If soap and water are not available, use an alcohol-based hand sanitizer. ? Avoid touching your mouth, face, eyes, or nose. ? Avoid going out in public, follow guidance from your state and local health authorities. ? If you must go out in public, wear a cloth face covering or face mask. Make sure your mask covers your nose and mouth. ? Avoid crowded indoor spaces. Stay at least 6 feet (2 meters) away from others. ? Disinfect objects and surfaces that are frequently touched every day. This may include:  Counters and tables.  Doorknobs and light switches.  Sinks and faucets.  Electronics, such as phones, remote controls, keyboards, computers, and tablets. To protect others: If you have symptoms of COVID-19, take steps to prevent the virus from spreading to  others.  If you think you have a COVID-19 infection, contact your health care provider right away. Tell your health care team that you think you may have a COVID-19 infection.  Stay home. Leave your house only to seek medical care. Do not use public transport.  Do not travel while you are sick.  Wash your hands often with soap and water for 20 seconds. If soap and water are not available, use alcohol-based hand sanitizer.  Stay away from other members of your household. Let healthy household members care for children and pets, if possible. If you have to care for children or pets, wash your hands often and wear a mask. If possible, stay in your own room, separate from others. Use a different bathroom.  Make sure that all people in your household wash their hands well and often.  Cough or sneeze into a tissue or your sleeve or elbow. Do not cough or sneeze into your hand or into the air.  Wear a cloth face covering or face mask. Make sure your mask covers your nose and mouth. Where to find more information  Centers for Disease Control and Prevention: StickerEmporium.tn  World Health Organization: https://thompson-craig.com/ Contact a health care provider if:  You live in or have traveled to an area where COVID-19 is a risk and you have symptoms of the infection.  You have had contact with someone who has COVID-19 and you have symptoms of the infection. Get help right away if:  You have trouble breathing.  You have pain or pressure in your chest.  You have confusion.  You have bluish lips and fingernails.  You have difficulty waking from sleep.  You have symptoms that get worse. These symptoms may represent a serious problem that is an emergency. Do not wait to see if the symptoms will go away. Get medical help right away. Call your local emergency services (911 in the [place] Do not drive yourself to the hospital. Let the emergency medical  personnel know if you think you have COVID-19. Summary  COVID-19 is a respiratory infection that is caused by a virus. It is also known as coronavirus disease or novel coronavirus. It can cause serious infections, such as pneumonia, acute respiratory distress syndrome, acute respiratory failure, or sepsis.  The virus that causes COVID-19 is contagious. This means that it can spread from person to person through droplets from breathing, speaking, singing, coughing, or sneezing.  You are more likely to develop a serious illness if you are 34 years of age or older, have a weak immune system, live in a nursing home, or have chronic disease.  There is no medicine to treat COVID-19. Your health care provider will talk with you about ways to treat your symptoms.  Take steps to protect yourself and others from infection. Wash your hands often and disinfect objects and surfaces that are frequently touched every day. Stay away from people who are sick and wear a mask if you are sick. This information is not intended to replace advice given to you by your health care provider. Make sure you discuss any questions you have with your health care provider. Document Revised: 07/23/2019 Document Reviewed: 10/29/2018 Elsevier Patient Education  2020 [employer].

## 2020-07-17 NOTE — Progress Notes (Addendum)
Virtual Visit via  Video Note  This visit type was conducted due to national recommendations for restrictions regarding the COVID-19 pandemic (e.g. social distancing).  This format is felt to be most appropriate for this patient at this time.  All issues noted in this document were discussed and addressed.  No physical exam was performed (except for noted visual exam findings with Video Visits).   I connected with@ on 07/19/20 at  2:00 PM EDT by a video enabled telemedicine application or telephone and verified that I am speaking with the correct person using two identifiers. Location patient: home Location provider: work or home office Persons participating in the virtual visit: patient, provider  I discussed the limitations, risks, security and privacy concerns of performing an evaluation and management service by telephone and the availability of in person appointments. I also discussed with the patient that there may be a patient responsible charge related to this service. The patient expressed understanding and agreed to proceed.  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  Chest x-ray consistent with viral pneumonia.  Blood work showed mild hyponatremia, mild AKI with creatinine 1.26 and bicarb of 17, lactic acid 1.5.  Patient was hydrated in the ER.  He had a prolonged hospital stay, as he was hypoxic and unable to wean off oxygen for 2 weeks.   Echocardiogram on 06/18/2020 2-day showed: Left ventricular ejection fraction is 60 to 65%.  The ventricles and the mitral and aortic valve structure normal.  Trivial mitral valve regurgitation.  His CT angio chest PPE with and without contrast on 06/16/2020: Showed no evidence of  pulmonary embolus.  Known Covid pneumonia with coalescing ground glass opacities throughout both lungs, multifocal primarily subpleural opacities persist.  There is developing bronchiectasis in the right middle lobe and lingula.  Aortic atherosclerosis.  He was seen by Dr. Sarina Ser for persistent hypoxia in the setting of COVID-19 pneumonia.   The  patient received remdesivir, and baricitinab and Keflex. He was not intubated.  He was discharged on O2 2 L oxygen while ambulating and activity.  He was advised heart healthy diet.  He was discharged on albuterol inhaler, vitamin C for 7 days, dexamethasone 6 mg for 7-day, and zinc capsules for 7 days.  Dwayne Dalton was hospitalized 05/30/20-06/19/20 and this is his first follow-up visit after hospitalization.  His father moved in with him right after discharge, the patient has been living alone again for several weeks.  He reports he has been doing pretty well but he has been coughing a lot, and still having shortness of breath with low O2 sats when he is walking from room to room.  He actually does not check his pulse oximeter and does not know what that is. His  father and brother notice that he is breathing hard and panting.  When he sitting he is breathing fine. He is wearing his oxygen an undetermined amount of time.  He does have a frequent, dry non- productive cough.  He has not been taking any medication for the cough.  He has not been able to use the albuterol inhaler despite instruction.  He reports no medical history because he has not seen a physician since he was a child.  His father  takes an active part in the video today and reports that Dwayne Dalton has a motor system that was never completely developed and there are things that he does not understand and that he cannot keep up with.  He lives independently, heats his food in the microwave- frozen foods such as pizza, and his family provide transportation for him. He works at [employer] in [employer].   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  He has been out of work and they need a short-term disability form filled out as soon as possible.    ROS: See pertinent positives and negatives per HPI.  Dwayne Dalton answers the review of systems questions today: He reports he gets occasional headache, sinus not currently.  No chest pain except when he coughs it hurts in the lower bilateral rib area.  It only hurts when he coughs.  He has not seen any phlegm.  No fevers or chills. Not sure how well he is sleeping. Positive fatigue that comes and goes.  No dizziness or lightheadedness.  He is tolerating his diet well and has had no nausea or vomiting or diarrhea.  He is doing some light  chores in the house, laundry, take showers, but mostly he sits because his breathing limits his activity.    Past Medical History:  Diagnosis Date  . Motor developmental delay   . Obesity, Class III, BMI 40-49.9 (morbid obesity) (HCC) 05/31/2020    History reviewed. No pertinent surgical history.  Family History  Problem Relation Age of Onset  . Alzheimer's disease Mother   . Diabetes Father   . Heart attack Father   . Hypertension Father     SOCIAL HX: Never smoked. 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   Current Outpatient Medications:  .  albuterol (PROVENTIL) (2.5 MG/3ML) 0.083% nebulizer solution, Take 3 mLs (2.5 mg total) by nebulization every 6 (six) hours as needed for wheezing or shortness of breath., Disp: 150 mL, Rfl: 1 .  benzonatate (TESSALON PERLES) 100 MG capsule, Take 2 capsules (200 mg total) by mouth 3 (three) times daily as needed for up to 7 days for cough., Disp: 21 capsule, Rfl: 0 .  predniSONE (DELTASONE) 10 MG tablet, Take 6 tablets ( total 60 mg) by mouth for 3 days; and then reduce by 1 tablet every day (10 mg) until off., Disp: 33 tablet, Rfl: 0  EXAM:  VITALS per patient if applicable: Weight is 198 pounds, he was well over 200 pounds according to his father he starting to gain some  back.  GENERAL: alert, oriented, appears disheveled, answering questions, and appears in no acute distress at rest.  He is very pleasant and engaged in the video at rest.  After walking around the room a few times, he is visibly short of breath, with breathiness with speaking.  He recovers quickly when he sits down.  He is wearing the oxygen the entire visit.  HEENT: atraumatic, conjunctiva clear, no obvious abnormalities on inspection of external nose and ears  NECK: normal movements of the head and neck  LUNGS: He is wearing O2 per 2 L per nasal cannula continuous.  At rest his pulse oximetry 99%. After ambulating around the room, he has visible shortness of breath and his pulse oximetry dropped to 90%.  He sits down for just a few minutes, and the O2 sats are up to 95%. No wheezing.  Positive cough dry, not congested or deep at all.  More like a clearing of  the throat.   CV: no obvious cyanosis  MS: moves all visible extremities without noticeable abnormality  PSYCH/NEURO: pleasant and cooperative, no obvious depression or anxiety, speech and thought processing grossly intact  ASSESSMENT AND PLAN:  Discussed the following assessment and plan:  Pneumonia due to COVID-19 virus - Plan: Ambulatory referral to Pulmonology  COVID-19  DOE (dyspnea on exertion) - Plan: Ambulatory referral to Pulmonology  Class 1 obesity due to excess calories with body mass index (BMI) of 31.0 to 31.9 in adult, unspecified whether serious comorbidity present  Disorder of motor nervous system  Obesity, Class III, BMI 40-49.9 (morbid obesity) (HCC)  AKI (acute kidney injury) (HCC)  No problem-specific Assessment & Plan notes found for this encounter.  Dwayne Dalton has an appointment at the [facility] clinic on Tuesday 10/19 at 4 PM. Since he was seen by Dr. Sarina Ser in the hospital, I will contact her to see if it would be more appropriate for him to go to the pulmonary clinic if he can be seen sooner for an  in person examination.  This case was discussed with Dr. Darrick Huntsman in collaboration of care.  We discussed options to to help him with his cough, and exertional hypoxia:  Please begin a prednisone taper where you take 6 pills today, 6 pills tomorrow, 6 pills the next day and then 5 pills the following day and continue to decrease by 1 pill daily until off.  I have also ordered an albuterol in the form of a nebulizer since he cannot use the inhaler at the house.  The nebulizer should be easier for him to use. We can have the father come into the clinic when he picks up the short-term disability papers, and the nurse can teach him how to use the nebulizer and hen he can go home and teach Dwayne Dalton.   Please make follow-up video visit with me in 3 weeks.  Seek emergency care if you get worse with breathing, oxygen saturations stay below 90%, or Dwayne Dalton develops any chest pain, dizziness or lightheadedness, vomiting, abdominal pain, or new worrisome symptoms.  I discussed the assessment and treatment plan with the patient. The patient was provided an opportunity to ask questions and all were answered. The patient agreed with the plan and demonstrated an understanding of the instructions.   The patient was advised to call back or seek an in-person evaluation if the symptoms worsen or if the condition fails to improve as anticipated.  Amedeo Kinsman, NP Adult Nurse Practitioner [facility] [employer] [phone]

## 2020-07-17 NOTE — Telephone Encounter (Signed)
Pt dropped off [employer] for FMLA-placed in folder up front

## 2020-07-18 ENCOUNTER — Telehealth: Payer: Self-pay | Admitting: General Practice

## 2020-07-18 NOTE — Telephone Encounter (Signed)
Yes, I discussed Tessalon Perles and called them in. Let me know if the cough does not show improvement. He can also take Robitussin DM sold over the counter. The nebulizer and the prednisone will help.

## 2020-07-18 NOTE — Telephone Encounter (Signed)
Call him and let him know the new forms that the company sent are ready for pick up. And he is to meet with Olegario Messier to get the Nebulizer and learn how to use it. Please get a symptom update on Jeff.

## 2020-07-18 NOTE — Telephone Encounter (Signed)
FMLA papers placed in folder for Dwayne Setters, NP to fill out

## 2020-07-18 NOTE — Telephone Encounter (Signed)
Patient  Called in about FMLA paperwork and nebulizer

## 2020-07-18 NOTE — Telephone Encounter (Signed)
Pt father called and said that they were told that a cough medicine was going to be called in at yesterdays appt

## 2020-07-18 NOTE — Telephone Encounter (Signed)
Forms are not ready yet and I advised patient of that 2 hrs ago that they may not be done today. I advised patient we would call as soon as they are able to be finished.

## 2020-07-18 NOTE — Telephone Encounter (Signed)
Patient aware rx was called into pharmacy.  

## 2020-07-19 ENCOUNTER — Encounter: Payer: Self-pay | Admitting: Nurse Practitioner

## 2020-07-19 DIAGNOSIS — R0609 Other forms of dyspnea: Secondary | ICD-10-CM | POA: Insufficient documentation

## 2020-07-19 DIAGNOSIS — R0600 Dyspnea, unspecified: Secondary | ICD-10-CM | POA: Insufficient documentation

## 2020-07-19 DIAGNOSIS — E6609 Other obesity due to excess calories: Secondary | ICD-10-CM | POA: Insufficient documentation

## 2020-07-19 MED ORDER — BENZONATATE 100 MG PO CAPS: 200.0000 mg | ORAL_CAPSULE | Freq: Three times a day (TID) | ORAL | 0 refills | Status: DC | PRN

## 2020-07-19 NOTE — Addendum Note (Signed)
Addended by: Amedeo Kinsman A on: 07/19/2020 07:01 AM   Modules accepted: Orders

## 2020-07-19 NOTE — Telephone Encounter (Signed)
Forms are done and ready for pick up. Patients father aware

## 2020-07-19 NOTE — Telephone Encounter (Signed)
Patients father aware papers are ready for pickup

## 2020-07-20 ENCOUNTER — Telehealth: Payer: Self-pay | Admitting: Nurse Practitioner

## 2020-07-20 NOTE — Telephone Encounter (Signed)
Please call him for symptom update:   How is he tolerating the prednisone.   Has he started his nebulizer treatments.   How are his oxygen saturations at rest, and walking around the room.   Does he seem to be getting better this week?   Any new symptoms or problems from 4 days ago?  He has a visit at the pulmonary clinic on Tuesday at 4 pm. It is very important for him to go to that visit. Do  they know where to go?

## 2020-07-20 NOTE — Telephone Encounter (Signed)
Spoke with patients father about forms; other son was not authorized to pick them up. They have now been faxed where they need to be faxed to.

## 2020-07-20 NOTE — Telephone Encounter (Signed)
Please call pt's father back about forms

## 2020-07-21 NOTE — Telephone Encounter (Signed)
Pt returned your call.  

## 2020-07-21 NOTE — Telephone Encounter (Signed)
Spoke with patients dad; he hasnt been home yet to see how well hes doing today and has not talked to the other son today. He did say he is still coughing but has not had any new sx that he knows of. He was on the way home so I asked him to call us back later today with update on patient.

## 2020-07-21 NOTE — Telephone Encounter (Signed)
Spoke with patients dad and he states patient is doing well on the predinsone and doing better other then slight cough. Patient is having a hard time with the nebulizer treatments. Dad is going to get a mask for nebulizer to help him do the treatments better. Patient does not have any other sx. States his brother is taking him to the appt next week and they do know where to go.

## 2020-07-25 ENCOUNTER — Ambulatory Visit (INDEPENDENT_AMBULATORY_CARE_PROVIDER_SITE_OTHER): Payer: Managed Care, Other (non HMO) | Admitting: Nurse Practitioner

## 2020-07-25 ENCOUNTER — Telehealth: Payer: Self-pay

## 2020-07-25 VITALS — BP 150/98 | HR 107 | Temp 97.7°F | Ht 67.0 in | Wt 204.0 lb

## 2020-07-25 DIAGNOSIS — U071 COVID-19: Secondary | ICD-10-CM

## 2020-07-25 DIAGNOSIS — J1282 Pneumonia due to coronavirus disease 2019: Secondary | ICD-10-CM

## 2020-07-25 MED ORDER — BENZONATATE 100 MG PO CAPS: 200.0000 mg | ORAL_CAPSULE | Freq: Three times a day (TID) | ORAL | 0 refills | Status: AC | PRN

## 2020-07-25 NOTE — Progress Notes (Signed)
@Patient  ID: , male    DOB: 1966/04/03, 54 y.o.   MRN: [id number]  Chief Complaint  Patient presents with  . Hospitalization Follow-up    +8/24 Hosp: 8/24-9/13Slight cough, SOB uses 2L when needed. O2 92% while walking to intake, went up to 97% fairly quick.     Referring provider: No ref. provider found  54 year old male with history of motor developmental delay and obesity.   HPI  Patient presents today for post COVID care clinic visit.  Patient was admitted to the hospital on 05/30/2020 and discharged on 06/19/2020.  Patient was admitted with Covid pneumonia.  Patient was treated with remdesivir and baricitinib.  Patient was discharged home on 2 L of O2.  Patient states that he has been doing well since hospital discharge.  He states that he only occasionally needs his oxygen.  While ambulating in the office today his O2 sats did drop to 92% on room air but recovered quickly up to 97% with rest.  Patient does have albuterol inhaler to use as needed and Tessalon Perles.  Patient does already have an appointment set up to follow-up with pulmonary.  Patient is try to stay active and does want to return to work as soon as possible. Denies f/c/s, n/v/d, hemoptysis, PND, chest pain or edema.         No Known Allergies  Immunization History  Administered Date(s) Administered  . Moderna SARS-COVID-2 Vaccination 07/08/2020    Past Medical History:  Diagnosis Date  . Motor developmental delay   . Obesity, Class III, BMI 40-49.9 (morbid obesity) (HCC) 05/31/2020    Tobacco History: Social History   Tobacco Use  Smoking Status Never Smoker  Smokeless Tobacco Never Used   Counseling given: Not Answered   Outpatient Encounter Medications as of 07/25/2020  Medication Sig  . albuterol (PROVENTIL) (2.5 MG/3ML) 0.083% nebulizer solution Take 3 mLs (2.5 mg total) by nebulization every 6 (six) hours as needed for wheezing or shortness of breath.  . benzonatate  (TESSALON PERLES) 100 MG capsule Take 2 capsules (200 mg total) by mouth 3 (three) times daily as needed for up to 7 days for cough.  . [DISCONTINUED] benzonatate (TESSALON PERLES) 100 MG capsule Take 2 capsules (200 mg total) by mouth 3 (three) times daily as needed for up to 7 days for cough. (Patient not taking: Reported on 07/25/2020)  . [DISCONTINUED] predniSONE (DELTASONE) 10 MG tablet Take 6 tablets ( total 60 mg) by mouth for 3 days; and then reduce by 1 tablet every day (10 mg) until off.   No facility-administered encounter medications on file as of 07/25/2020.     Review of Systems  Review of Systems  Constitutional: Negative.  Negative for fever.  HENT: Negative.   Respiratory: Positive for cough and shortness of breath.   Cardiovascular: Negative.  Negative for chest pain, palpitations and leg swelling.  Gastrointestinal: Negative.   Allergic/Immunologic: Negative.   Neurological: Negative.   Psychiatric/Behavioral: Negative.        Physical Exam  BP (!) 150/98 (BP Location: Left Arm)   Pulse (!) 107   Temp 97.7 F (36.5 C)   Ht 5\' 7"  (1.702 m)   Wt 204 lb 0.1 oz (92.5 kg)   SpO2 97%   BMI 31.95 kg/m   Wt Readings from Last 5 Encounters:  07/25/20 204 lb 0.1 oz (92.5 kg)  07/17/20 200 lb (90.7 kg)  05/30/20 275 lb (124.7 kg)     Physical Exam Vitals and  nursing note reviewed.  Constitutional:      General: He is not in acute distress.    Appearance: He is well-developed.  Cardiovascular:     Rate and Rhythm: Normal rate and regular rhythm.  Pulmonary:     Effort: Pulmonary effort is normal.     Breath sounds: Normal breath sounds.  Musculoskeletal:     Right lower leg: No edema.     Left lower leg: No edema.  Skin:    General: Skin is warm and dry.  Neurological:     Mental Status: He is alert and oriented to person, place, and time.  Psychiatric:        Mood and Affect: Mood normal.        Behavior: Behavior normal.        Assessment &  Plan:   Pneumonia due to COVID-19 virus Cough:   Stay well hydrated  Stay active  Deep breathing exercises  May start vitamin C daily, vitamin D3 daily, Zinc daily  May take tylenol or fever or pain  May take mucinex DM twice daily  Please keep upcoming appointment with pulmonary   Follow up:  Follow up as needed      Ivonne Andrew, NP 07/26/2020

## 2020-07-25 NOTE — Telephone Encounter (Signed)
-----   Message from Salena Saner, MD sent at 07/21/2020  9:26 AM EDT ----- I saw the patient briefly in consultation during his hospitalization.  This was done in preparation for discharge.  My concern at that time was not so much with his hypoxia but rather with his ability or lack thereof to take care of himself.  This was conveyed to the primary team.  Unfortunately there is nothing that can be done some of these patients linger like this for 12 weeks or more.  Some even take up to 6 months to a year to improve.  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    My first availability in the clinic is not until November I will be happy to see him then, unfortunately at the beginning of Covid we lost two physicians to other endeavors and between the remainder physician and myself we are covering both ICU and clinic and this has really created a scheduling crunch.  I recommend he keep the appointment at the [facility] clinic.  You may try  budesonide (Pulmicort) inhaler or via nebulizer twice a day this may help with his cough.  May also use Mucinex and add Acapella for airway clearance.  But there is nothing special to do except tincture of time.  Hope this helps,  Dr. Reece Agar   ----- Message ----- From: Theadore Nan, NP Sent: 07/17/2020  11:02 PM EDT To: Salena Saner, MD

## 2020-07-25 NOTE — Telephone Encounter (Signed)
Patient has been scheduled for 08/23/2020 at 10:00a. Patient's dad, lonnie(DPR) is aware and voiced his understanding.  Nothing further needed.

## 2020-07-25 NOTE — Patient Instructions (Addendum)
Covid 19 Cough:   Stay well hydrated  Stay active  Deep breathing exercises  May start vitamin C daily, vitamin D3 daily, Zinc daily  May take tylenol or fever or pain  May take mucinex DM twice daily  Please keep upcoming appointment with pulmonary   Follow up:  Follow up as needed

## 2020-07-26 ENCOUNTER — Encounter: Payer: Self-pay | Admitting: Nurse Practitioner

## 2020-07-26 NOTE — Assessment & Plan Note (Signed)
Cough:   Stay well hydrated  Stay active  Deep breathing exercises  May start vitamin C daily, vitamin D3 daily, Zinc daily  May take tylenol or fever or pain  May take mucinex DM twice daily  Please keep upcoming appointment with pulmonary   Follow up:  Follow up as needed

## 2020-07-27 NOTE — Telephone Encounter (Signed)
I have printed the form. The last time I had a video visit with Dwayne Dalton was when he was wearing O2 and desaturating to 90%. Very SOB with walking in the room. I will need a video visit with him and his dad next week to complete this form. I need to assess him to get an idea of what he is able to do at work and when he thinks he will be well enough. Will he need any adjustments in work duties or schedule?  PLAN: Please schedule a video visit next week.

## 2020-08-01 ENCOUNTER — Telehealth (INDEPENDENT_AMBULATORY_CARE_PROVIDER_SITE_OTHER): Payer: Managed Care, Other (non HMO) | Admitting: Nurse Practitioner

## 2020-08-01 ENCOUNTER — Other Ambulatory Visit: Payer: Self-pay

## 2020-08-01 VITALS — Ht 67.01 in | Wt 205.0 lb

## 2020-08-01 DIAGNOSIS — R0600 Dyspnea, unspecified: Secondary | ICD-10-CM

## 2020-08-01 DIAGNOSIS — U071 COVID-19: Secondary | ICD-10-CM

## 2020-08-01 DIAGNOSIS — J1282 Pneumonia due to coronavirus disease 2019: Secondary | ICD-10-CM

## 2020-08-01 DIAGNOSIS — R0609 Other forms of dyspnea: Secondary | ICD-10-CM

## 2020-08-01 NOTE — Progress Notes (Signed)
Virtual Visit via telephone Note  This visit type was conducted due to national recommendations for restrictions regarding the COVID-19 pandemic (e.g. social distancing).  This format is felt to be most appropriate for this patient at this time.  All issues noted in this document were discussed and addressed.  No physical exam was performed (except for noted visual exam findings with Video Visits).   I connected with@ on 08/01/20 at  4:00 PM EDT by a video enabled telemedicine application or telephone and verified that I am speaking with the correct person using two identifiers. Location patient: home Location provider: work or home office Persons participating in the virtual visit: patient, provider  I discussed the limitations, risks, security and privacy concerns of performing an evaluation and management service by telephone and the availability of in person appointments. I also discussed with the patient that there may be a patient responsible charge related to this service. The patient expressed understanding and agreed to proceed.  Interactive audio and video telecommunications were attempted between this provider and patient, however failed, due to patient having technical difficulties with camera.  We continued and completed visit with audio only.   Reason for visit: To discuss ST disability and when he can return to work.    HPI: This 54 year old patient with history of disorder of more motor nervous system, obesity, and recent hospitalization for Covid pneumonia presents with telephone visit today along with his father to discuss his short-term disability.  Since our last visit on 07/17/2020, he was seen in the Covid clinic.  He has continued to improve and no longer needs supplemental oxygen therapy.  His pulse oximetry today on room air is 97%, when he walked in the room for about 3 minutes during conversation, his sats dropped to 94%.  He does not sound short of breath with talking.  He  does report a sense of shortness of breath.  He notes that when he walks any type of long distance he definitely feels short of breath still.  He is not quite back to baseline but he is improving.  He does have a pulmonary appointment with Dr. Sarina Ser on 08/23/2020.  He is he is not utilizing his albuterol anymore.  We discussed that albuterol twice a day may help with walking long distance.  He is unable to use the inhaler device. He does  have nebulizer, but has difficulty using the nebulizer,as well.  He has no chest tightness, wheezing, or cough.  The cough has resolved.  ROS: See pertinent positives and negatives per HPI.  Past Medical History:  Diagnosis Date  . Motor developmental delay   . Obesity, Class III, BMI 40-49.9 (morbid obesity) (HCC) 05/31/2020    No past surgical history on file.  Family History  Problem Relation Age of Onset  . Alzheimer's disease Mother   . Diabetes Father   . Heart attack Father   . Hypertension Father     SOCIAL HX: No tobacco history   Current Outpatient Medications:  .  albuterol (PROVENTIL) (2.5 MG/3ML) 0.083% nebulizer solution, Take 3 mLs (2.5 mg total) by nebulization every 6 (six) hours as needed for wheezing or shortness of breath., Disp: 150 mL, Rfl: 1 .  benzonatate (TESSALON PERLES) 100 MG capsule, Take 2 capsules (200 mg total) by mouth 3 (three) times daily as needed for up to 7 days for cough., Disp: 21 capsule, Rfl: 0  EXAM:  VITALS per patient if applicable: wt is up 205 lbs, HR 107  at rest and increase to 119 with activity. Pulse ox RA at rest 97% and with walking in room 94%.   GENERAL: alert, oriented, appears well and in no acute distress  HEENT: atraumatic, conjunctiva clear, no obvious abnormalities on inspection of external nose and ears  NECK: normal movements of the head and neck  LUNGS: on inspection no signs of respiratory distress, breathing rate appears normal, no obvious gross SOB, gasping or  wheezing  CV: no obvious cyanosis  MS: moves all visible extremities without noticeable abnormality  PSYCH/NEURO: pleasant and cooperative, no obvious depression or anxiety, speech and thought processing grossly intact  ASSESSMENT AND PLAN:  Discussed the following assessment and plan:  DOE (dyspnea on exertion)  Pneumonia due to COVID-19 virus  No problem-specific Assessment & Plan notes found for this encounter.  Patient advised:   You are continuing to improve. Covid pneumonia shortness of breath can take time to resolve.   You still have shortness of breath with activity, increase heart rate with activity and decrease oxygen saturation with activity.However, since out last visit a few weeks ago, you are not needing supplemental oxygen anymore and your oxygen saturation is improved. You are walking more outdoors. Your heart echo in Sept looked okay. Your chest CT in Sept showed Covid pneumonia changes and will need to be re evaluated for resolution. Please keep appt with Dr. Jayme Cloud in Pulmonary as planned Aug 23, 2020.   You may pick up the ST disability papers. I would recommend attempting to go back to work 08/28/2020 if Dr. Jayme Cloud agrees. You report trouble breathing through a 2-layer cloth mask, but breathe better with a surgical paper mask. You will need to talk to your employer about your mask request.   Please follow up in this clinic in 6 weeks.   I discussed the assessment and treatment plan with the patient. The patient was provided an opportunity to ask questions and all were answered. The patient agreed with the plan and demonstrated an understanding of the instructions.   The patient was advised to call back or seek an in-person evaluation if the symptoms worsen or if the condition fails to improve as anticipated.  I provided 15 minutes of time during this encounter. Amedeo Kinsman, NP Adult Nurse Practitioner [facility] [employer] [phone]

## 2020-08-02 ENCOUNTER — Encounter: Payer: Self-pay | Admitting: Nurse Practitioner

## 2020-08-02 NOTE — Patient Instructions (Addendum)
You are continuing to improve. Covid pneumonia shortness of breath can take time to resolve.   You still have shortness of breath with activity, increase heart rate with activity and decrease oxygen saturation with activity.However, since out last visit a few weeks ago, you are not needing supplemental oxygen anymore and your oxygen saturation is improved. You are walking more outdoors. Your heart echo in Sept looked okay. Your chest CT in Sept showed Covid pneumonia changes and will need to be re evaluated for resolution. Please keep appt with Dr. Jayme Cloud in Pulmonary as planned Aug 23, 2020.   You may pick up the ST disability papers. I would recommend attempting to go back to work 08/28/2020 if Dr. Jayme Cloud agrees. You report trouble breathing through a 2-layer cloth mask, but breathe better with a surgical paper mask. You will need to talk to your employer about your mask request.   Please follow up in this clinic in 6 weeks.

## 2020-08-22 ENCOUNTER — Telehealth: Payer: Self-pay | Admitting: Pulmonary Disease

## 2020-08-22 NOTE — Telephone Encounter (Signed)
Re'd fmla paperwork via fax.Interofficed to [place] for processing.

## 2020-08-23 ENCOUNTER — Other Ambulatory Visit: Payer: Self-pay

## 2020-08-23 ENCOUNTER — Ambulatory Visit (INDEPENDENT_AMBULATORY_CARE_PROVIDER_SITE_OTHER): Payer: Managed Care, Other (non HMO) | Admitting: Pulmonary Disease

## 2020-08-23 ENCOUNTER — Encounter: Payer: Self-pay | Admitting: Pulmonary Disease

## 2020-08-23 VITALS — BP 148/84 | HR 104 | Temp 98.0°F | Ht 67.0 in | Wt 212.8 lb

## 2020-08-23 DIAGNOSIS — F840 Autistic disorder: Secondary | ICD-10-CM

## 2020-08-23 DIAGNOSIS — R0902 Hypoxemia: Secondary | ICD-10-CM

## 2020-08-23 DIAGNOSIS — Z8616 Personal history of COVID-19: Secondary | ICD-10-CM

## 2020-08-23 NOTE — Patient Instructions (Signed)
You did well during your walk test today.  You do not need oxygen while walking.  Do recommend you stay active and lose some weight this will help you with your breathing.  We are going to check oxygen level at nighttime, this will help determine if we can get all of the oxygen equipment out.  We will see you here on an as-needed basis.

## 2020-08-23 NOTE — Progress Notes (Signed)
Subjective:    Patient ID: Dwayne Dalton, male    DOB: 01-12-1966, 54 y.o.   MRN: [id number]  HPI Patient is a 54 year old with autism spectrum who presents for follow-up with regards to COVID-19 infection with resultant hypoxia.  Patient was admitted to [facility] from 24 August through 13 September total 20 days.  He had not been vaccinated for COVID-19 at that point.  He had significant hypoxia requiring high flow O2.  Chest CT on 30 August showed extensive groundglass opacities and consolidation with peripheral preference consistent with COVID-19, CT angio chest was performed again on 10 September which showed marked improvement on these changes.  We evaluated the patient on 17 June 2020 because of persistent need for oxygen, for the details please refer to the consultation note on Epic.  Our concern at the time was not so much his need for oxygen but rather his capacity to take care of of himself.  His FiO2 requirements had continued to decrease and it appears that he was slowly improving but would take time.  We determined that he had likely "long Covid".  Since then he has continued to improve.  He was transiently on supplemental oxygen however he states that he has not used it recently.  He has not required use of any inhalers.  States I feel I no longer have problems".  He has not had any chest pain, orthopnea, paroxysmal nocturnal dyspnea or lower extremity edema.  No palpitations.  No fevers, chills or sweats since his COVID-19 diagnosis.  Patient's father is with him today and notes that he has been doing well.    Review of Systems A 10 point review of systems was performed and it is as noted above otherwise negative.  Patient Active Problem List   Diagnosis Date Noted  . DOE (dyspnea on exertion) 07/19/2020  . Class 1 obesity due to excess calories with body mass index (BMI) of 31.0 to 31.9 in adult 07/19/2020  . Disorder of motor nervous system 07/17/2020  . Pneumonia due to  COVID-19 virus 05/31/2020  . Diarrhea due to COVID-19 05/31/2020  . AKI (acute kidney injury) (HCC) 05/31/2020  . Metabolic acidosis 05/31/2020  . COVID-19 05/31/2020   No Known Allergies  Current Meds  Medication Sig  . albuterol (PROVENTIL) (2.5 MG/3ML) 0.083% nebulizer solution Take 3 mLs (2.5 mg total) by nebulization every 6 (six) hours as needed for wheezing or shortness of breath.  . zinc gluconate 50 MG tablet Take 50 mg by mouth daily.   Immunization History  Administered Date(s) Administered  . Moderna SARS-COVID-2 Vaccination 07/08/2020, 08/05/2020      Objective:   Physical Exam BP (!) 148/84 (BP Location: Left Arm, Cuff Size: Normal)   Pulse (!) 104   Temp 98 F (36.7 C) (Temporal)   Ht 5\' 7"  (1.702 m)   Wt 212 lb 12.8 oz (96.5 kg)   SpO2 95%   BMI 33.33 kg/m  GENERAL: Obese, no acute distress, fully ambulatory. HEAD: Normocephalic, atraumatic.  EYES: Pupils equal, round, reactive to light.  No scleral icterus.  MOUTH: Nose/mouth/throat not examined due to masking requirements for COVID 19. NECK: Supple. No thyromegaly. Trachea midline. No JVD.  No adenopathy. PULMONARY: Good air entry bilaterally.  No adventitious sounds. CARDIOVASCULAR: S1 and S2. Regular rate and rhythm.  No rubs, murmurs or gallops heard., Initial heart rate was 104 however on reexamination it was 80 bpm. ABDOMEN: Obese, benign. MUSCULOSKELETAL: No joint deformity, no clubbing, no edema.  NEUROLOGIC:  No overt focal deficit, no gait disturbance, speech is fluent SKIN: Intact,warm,dry.  No rashes on limited exam PSYCH: Behavior consistent with autism spectrum.  He is calm and cooperative.  Ambulatory oximetry was performed today: He maintained oxygen saturations at 92% or better.     Assessment & Plan:     ICD-10-CM   1. Hypoxia  R09.02 Pulse oximetry, overnight    AMB REFERRAL FOR DME   Ambulatory oximetry shows no hypoxia with exertion We will check overnight oximetry on room air   2. Personal history of COVID-19  Z86.16    Patient appears to have recovered completely from his COVID-19 infection  3. Autism spectrum disorder  F84.0    This issue adds complexity to his management   Orders Placed This Encounter  Procedures  . AMB REFERRAL FOR DME    Referral Priority:   Routine    Referral Type:   Durable Medical Equipment Purchase    Number of Visits Requested:   1  . Pulse oximetry, overnight    On room air. NOB:[id number]    Standing Status:   Future    Standing Expiration Date:   08/23/2021   Discussion:  Patient had fairly severe COVID-19 infection which manifested with severe hypoxic respiratory failure he was hospitalized for 20 days, never required intubation.  He appears to have recovered from that event.  He is no longer wearing oxygen.  Ambulatory oximetry today does not support ongoing use of oxygen.  We will check overnight oximetry to ensure that he has no nocturnal oxygen desaturations.  We will notify the patient and his father of these results.  If the patient does not show need for further oxygen supplementation follow-up here will be on an as-needed basis.  Gailen Shelter, MD [facility]   *This note was dictated using voice recognition software/Dragon.  Despite best efforts to proofread, errors can occur which can change the meaning.  Any change was purely unintentional.

## 2020-08-29 NOTE — Telephone Encounter (Signed)
Called and spoke to patient father - pt father states FMLA paperwork is no longer needed. -pr

## 2020-09-06 ENCOUNTER — Telehealth: Payer: Self-pay | Admitting: Pulmonary Disease

## 2020-09-06 ENCOUNTER — Institutional Professional Consult (permissible substitution): Payer: Managed Care, Other (non HMO) | Admitting: Internal Medicine

## 2020-09-06 NOTE — Telephone Encounter (Signed)
Re'd disability paperwork via fax. LVM informing pt of process and fees. Sent to [employer] in [place] for processing.//TM

## 2020-10-10 NOTE — Telephone Encounter (Signed)
Patrice, please advise if this message can be closed. Thanks.

## 2020-10-10 NOTE — Telephone Encounter (Signed)
Yes, message to be closed - I had spoken to patient father who stated that disability paperwork was no longer needed -pr

## 2020-12-10 ENCOUNTER — Encounter (HOSPITAL_COMMUNITY): Payer: Self-pay

## 2020-12-10 ENCOUNTER — Emergency Department (HOSPITAL_COMMUNITY)
Admission: EM | Admit: 2020-12-10 | Discharge: 2020-12-10 | Disposition: A | Discharge status: Home / Self Care | Payer: No Typology Code available for payment source | Attending: Emergency Medicine | Admitting: Emergency Medicine

## 2020-12-10 ENCOUNTER — Emergency Department (HOSPITAL_COMMUNITY): Payer: No Typology Code available for payment source

## 2020-12-10 ENCOUNTER — Other Ambulatory Visit: Payer: Self-pay

## 2020-12-10 DIAGNOSIS — Y9355 Activity, bike riding: Secondary | ICD-10-CM | POA: Diagnosis not present

## 2020-12-10 DIAGNOSIS — S01111A Laceration without foreign body of right eyelid and periocular area, initial encounter: Secondary | ICD-10-CM | POA: Insufficient documentation

## 2020-12-10 DIAGNOSIS — Z23 Encounter for immunization: Secondary | ICD-10-CM | POA: Diagnosis not present

## 2020-12-10 DIAGNOSIS — Z8616 Personal history of COVID-19: Secondary | ICD-10-CM | POA: Diagnosis not present

## 2020-12-10 DIAGNOSIS — Y92410 Unspecified street and highway as the place of occurrence of the external cause: Secondary | ICD-10-CM | POA: Diagnosis not present

## 2020-12-10 DIAGNOSIS — S0993XA Unspecified injury of face, initial encounter: Secondary | ICD-10-CM | POA: Diagnosis present

## 2020-12-10 DIAGNOSIS — T07XXXA Unspecified multiple injuries, initial encounter: Secondary | ICD-10-CM

## 2020-12-10 DIAGNOSIS — S0083XA Contusion of other part of head, initial encounter: Secondary | ICD-10-CM

## 2020-12-10 MED ORDER — ACETAMINOPHEN 500 MG PO TABS
1000.0000 mg | ORAL_TABLET | Freq: Once | ORAL | Status: AC
  Administered 2020-12-10: 1000 mg via ORAL
  Filled 2020-12-10: qty 2

## 2020-12-10 MED ORDER — TETANUS-DIPHTH-ACELL PERTUSSIS 5-2.5-18.5 LF-MCG/0.5 IM SUSY
0.5000 mL | PREFILLED_SYRINGE | Freq: Once | INTRAMUSCULAR | Status: AC
  Administered 2020-12-10: 0.5 mL via INTRAMUSCULAR
  Filled 2020-12-10: qty 0.5

## 2020-12-10 MED ORDER — BACITRACIN ZINC 500 UNIT/GM EX OINT
TOPICAL_OINTMENT | Freq: Once | CUTANEOUS | Status: AC
  Administered 2020-12-10: 2 via TOPICAL
  Filled 2020-12-10: qty 1.8

## 2020-12-10 NOTE — ED Notes (Signed)
Pt transported to CT at this time.
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
## 2020-12-10 NOTE — ED Notes (Signed)
Pts wound cleansed
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
## 2023-03-07 ENCOUNTER — Encounter (INDEPENDENT_AMBULATORY_CARE_PROVIDER_SITE_OTHER): Payer: No Typology Code available for payment source | Admitting: Vascular Surgery

## 2023-03-21 ENCOUNTER — Encounter (INDEPENDENT_AMBULATORY_CARE_PROVIDER_SITE_OTHER): Payer: Self-pay | Admitting: Nurse Practitioner

## 2023-03-21 ENCOUNTER — Ambulatory Visit (INDEPENDENT_AMBULATORY_CARE_PROVIDER_SITE_OTHER): Payer: Self-pay | Admitting: Nurse Practitioner

## 2023-03-21 VITALS — BP 172/98 | HR 75 | Resp 17 | Ht 67.0 in | Wt 245.0 lb

## 2023-03-21 DIAGNOSIS — E66811 Obesity, class 1: Secondary | ICD-10-CM

## 2023-03-21 DIAGNOSIS — I872 Venous insufficiency (chronic) (peripheral): Secondary | ICD-10-CM

## 2023-03-21 DIAGNOSIS — Z6831 Body mass index (BMI) 31.0-31.9, adult: Secondary | ICD-10-CM

## 2023-03-21 DIAGNOSIS — E6609 Other obesity due to excess calories: Secondary | ICD-10-CM

## 2023-03-21 NOTE — Progress Notes (Signed)
Subjective:    Patient ID: Dwayne Dalton, male    DOB: [birth year], 57 y.o.   MRN: [id number] Chief Complaint  Patient presents with   Establish Care    The patient is a 58 year old male who presents today due to having lower extremity edema as well as concern for discoloration of the bilateral lower extremity shins.  The patient has had a significant episode of cellulitis which prompted this concern.  He denies any open wounds or ulcerations.  He is having issues with lower extremity edema as well.  He denies any injury to the lower extremities.  There is no evidence of claudication or rest pain.    Review of Systems  Cardiovascular:  Positive for leg swelling.  Skin:  Positive for rash.  All other systems reviewed and are negative.      Objective:   Physical Exam Vitals reviewed.  HENT:     Head: Normocephalic.  Cardiovascular:     Rate and Rhythm: Normal rate.     Pulses: Normal pulses.  Pulmonary:     Effort: Pulmonary effort is normal.  Musculoskeletal:     Right lower leg: Edema present.     Left lower leg: Edema present.  Skin:    General: Skin is warm and dry.     Comments: Bilateral stasis dermatitis  Neurological:     Mental Status: He is alert and oriented to person, place, and time.  Psychiatric:        Mood and Affect: Mood normal.        Behavior: Behavior normal.        Thought Content: Thought content normal.        Judgment: Judgment normal.     BP (!) 172/98 (BP Location: Left Arm)   Pulse 75   Resp 17   Ht 5\' 7"  (1.702 m)   Wt 245 lb (111.1 kg)   BMI 38.37 kg/m   Past Medical History:  Diagnosis Date   Motor developmental delay    Obesity, Class III, BMI 40-49.9 (morbid obesity) (HCC) 05/31/2020    Social History   Socioeconomic History   Marital status: Single    Spouse name: Not on file   Number of children: Not on file   Years of education: Not on file   Highest education level: Not on file  Occupational History   Not on  file  Tobacco Use   Smoking status: Never   Smokeless tobacco: Never  Vaping Use   Vaping Use: Never used  Substance and Sexual Activity   Alcohol use: Never   Drug use: Never   Sexual activity: Not Currently  Other Topics Concern   Not on file  Social History Narrative   Not on file   Social Determinants of Health   Financial Resource Strain: Not on file  Food Insecurity: Not on file  Transportation Needs: Not on file  Physical Activity: Not on file  Stress: Not on file  Social Connections: Not on file  Intimate Partner Violence: Not on file    History reviewed. No pertinent surgical history.  Family History  Problem Relation Age of Onset   Alzheimer's disease Mother    Diabetes Father    Heart attack Father    Hypertension Father    Hyperlipidemia Brother    Hypertension Brother     No Known Allergies     Latest Ref Rng & Units 06/19/2020    5:57 AM 06/18/2020    6:56 AM 06/17/2020  5:50 AM  CBC  WBC 4.0 - 10.5 K/uL 15.5  16.2  16.2   Hemoglobin 13.0 - 17.0 g/dL 16.1  09.6  04.5   Hematocrit 39.0 - 52.0 % 40.5  40.4  40.7   Platelets 150 - 400 K/uL 279  278  328       CMP     Component Value Date/Time   NA 132 (L) 06/19/2020 0557   K 3.3 (L) 06/19/2020 0557   CL 86 (L) 06/19/2020 0557   CO2 32 06/19/2020 0557   GLUCOSE 169 (H) 06/19/2020 0557   BUN 25 (H) 06/19/2020 0557   CREATININE 1.03 06/19/2020 0557   CALCIUM 8.7 (L) 06/19/2020 0557   PROT 6.2 (L) 06/19/2020 0557   ALBUMIN 3.3 (L) 06/19/2020 0557   AST 28 06/19/2020 0557   ALT 107 (H) 06/19/2020 0557   ALKPHOS 67 06/19/2020 0557   BILITOT 1.0 06/19/2020 0557   GFRNONAA >60 06/19/2020 0557     No results found.     Assessment & Plan:   1. Venous stasis dermatitis of both lower extremities  Recommend:  The patient has large symptomatic varicose veins that are painful and associated with swelling. The patient is CEAP [id number]   I have had a long discussion with the patient  regarding  varicose veins and why they cause symptoms.  Patient will begin wearing graduated compression stockings class 1 on a daily basis, beginning first thing in the morning and removing them in the evening. The patient is instructed specifically not to sleep in the stockings.    The patient  will also begin using over-the-counter analgesics such as Motrin 600 mg po TID to help control the symptoms.    In addition, behavioral modification including elevation during the day will be initiated.      An ultrasound of the venous system will be obtained.   Further plans will be based on the ultrasound results and whether conservative therapies are successful at eliminating the pain and swelling.  - VAS [place] LOWER EXTREMITY VENOUS REFLUX; Future  2. Class 1 obesity due to excess calories with body mass index (BMI) of 31.0 to 31.9 in adult, unspecified whether serious comorbidity present This can contribute to the patient's lower extremity edema.   Current Outpatient Medications on File Prior to Visit  Medication Sig Dispense Refill   albuterol (PROVENTIL) (2.5 MG/3ML) 0.083% nebulizer solution Take 3 mLs (2.5 mg total) by nebulization every 6 (six) hours as needed for wheezing or shortness of breath. 150 mL 1   mupirocin ointment (BACTROBAN) 2 % Apply topically 3 (three) times daily.     zinc gluconate 50 MG tablet Take 50 mg by mouth daily.     No current facility-administered medications on file prior to visit.    There are no Patient Instructions on file for this visit. No follow-ups on file.   Georgiana Spinner, NP

## 2023-03-27 ENCOUNTER — Ambulatory Visit (INDEPENDENT_AMBULATORY_CARE_PROVIDER_SITE_OTHER): Payer: Self-pay | Admitting: Nurse Practitioner

## 2023-03-27 ENCOUNTER — Ambulatory Visit (INDEPENDENT_AMBULATORY_CARE_PROVIDER_SITE_OTHER): Payer: Self-pay

## 2023-03-27 ENCOUNTER — Encounter (INDEPENDENT_AMBULATORY_CARE_PROVIDER_SITE_OTHER): Payer: Self-pay | Admitting: Nurse Practitioner

## 2023-03-27 VITALS — BP 160/111 | HR 73 | Resp 16 | Wt 244.6 lb

## 2023-03-27 DIAGNOSIS — I872 Venous insufficiency (chronic) (peripheral): Secondary | ICD-10-CM

## 2023-03-27 DIAGNOSIS — E6609 Other obesity due to excess calories: Secondary | ICD-10-CM

## 2023-03-27 DIAGNOSIS — I890 Lymphedema, not elsewhere classified: Secondary | ICD-10-CM

## 2023-03-27 DIAGNOSIS — Z6831 Body mass index (BMI) 31.0-31.9, adult: Secondary | ICD-10-CM

## 2023-03-27 NOTE — Progress Notes (Signed)
Subjective:    Patient ID: Dwayne Dalton, male    DOB: [birth year], 57 y.o.   MRN: [id number] Chief Complaint  Patient presents with   Follow-up    Ultrasound follow up    The patient is a 57 year old male who presents today due to having lower extremity edema as well as concern for discoloration of the bilateral lower extremity shins.  The patient has had a significant episode of cellulitis which prompted this concern.  He denies any open wounds or ulcerations.  He is having issues with lower extremity edema as well.  He denies any injury to the lower extremities.  There is no evidence of claudication or rest pain.  The noninvasive study showed no evidence of DVT or superficial thrombophlebitis bilaterally.  No evidence of deep venous insufficiency or superficial venous reflux bilaterally.    Review of Systems  Cardiovascular:  Positive for leg swelling.  Skin:  Positive for color change.  All other systems reviewed and are negative.      Objective:   Physical Exam Vitals reviewed.  HENT:     Head: Normocephalic.  Cardiovascular:     Rate and Rhythm: Normal rate.     Pulses:          Dorsalis pedis pulses are 1+ on the right side and 1+ on the left side.  Musculoskeletal:     Right lower leg: 1+ Edema present.     Left lower leg: 1+ Edema present.  Skin:    General: Skin is warm and dry.  Neurological:     Mental Status: He is alert and oriented to person, place, and time.  Psychiatric:        Mood and Affect: Mood normal.        Behavior: Behavior normal.        Thought Content: Thought content normal.        Judgment: Judgment normal.     BP (!) 160/111 (BP Location: Right Arm)   Pulse 73   Resp 16   Wt 244 lb 9.6 oz (110.9 kg)   BMI 38.31 kg/m   Past Medical History:  Diagnosis Date   Motor developmental delay    Obesity, Class III, BMI 40-49.9 (morbid obesity) (HCC) 05/31/2020    Social History   Socioeconomic History   Marital status: Single     Spouse name: Not on file   Number of children: Not on file   Years of education: Not on file   Highest education level: Not on file  Occupational History   Not on file  Tobacco Use   Smoking status: Never   Smokeless tobacco: Never  Vaping Use   Vaping Use: Never used  Substance and Sexual Activity   Alcohol use: Never   Drug use: Never   Sexual activity: Not Currently  Other Topics Concern   Not on file  Social History Narrative   Not on file   Social Determinants of Health   Financial Resource Strain: Not on file  Food Insecurity: Not on file  Transportation Needs: Not on file  Physical Activity: Not on file  Stress: Not on file  Social Connections: Not on file  Intimate Partner Violence: Not on file    Past Surgical History:  Procedure Laterality Date   CYST REMOVAL NECK      Family History  Problem Relation Age of Onset   Alzheimer's disease Mother    Diabetes Father    Heart attack Father  Hypertension Father    Hyperlipidemia Brother    Hypertension Brother     No Known Allergies     Latest Ref Rng & Units 06/19/2020    5:57 AM 06/18/2020    6:56 AM 06/17/2020    5:50 AM  CBC  WBC 4.0 - 10.5 K/uL 15.5  16.2  16.2   Hemoglobin 13.0 - 17.0 g/dL 40.9  81.1  91.4   Hematocrit 39.0 - 52.0 % 40.5  40.4  40.7   Platelets 150 - 400 K/uL 279  278  328       CMP     Component Value Date/Time   NA 132 (L) 06/19/2020 0557   K 3.3 (L) 06/19/2020 0557   CL 86 (L) 06/19/2020 0557   CO2 32 06/19/2020 0557   GLUCOSE 169 (H) 06/19/2020 0557   BUN 25 (H) 06/19/2020 0557   CREATININE 1.03 06/19/2020 0557   CALCIUM 8.7 (L) 06/19/2020 0557   PROT 6.2 (L) 06/19/2020 0557   ALBUMIN 3.3 (L) 06/19/2020 0557   AST 28 06/19/2020 0557   ALT 107 (H) 06/19/2020 0557   ALKPHOS 67 06/19/2020 0557   BILITOT 1.0 06/19/2020 0557   GFRNONAA >60 06/19/2020 0557     No results found.     Assessment & Plan:   1. Venous stasis dermatitis of both lower  extremities No evidence of cellulitis today.  Patient advised to follow conservative therapy as outlined below.   2. Class 1 obesity due to excess calories with body mass index (BMI) of 31.0 to 31.9 in adult, unspecified whether serious comorbidity present This can also contribute to lower extremity edema.  3. Lymphedema Recommend:  I have had a long discussion with the patient regarding swelling and why it  causes symptoms.  Patient will begin wearing graduated compression on a daily basis a prescription was given. The patient will  wear the stockings first thing in the morning and removing them in the evening. The patient is instructed specifically not to sleep in the stockings.   In addition, behavioral modification will be initiated.  This will include frequent elevation, use of over the counter pain medications and exercise such as walking.  Consideration for a lymph pump will also be made based upon the effectiveness of conservative therapy.  This would help to improve the edema control and prevent sequela such as ulcers and infections    Current Outpatient Medications on File Prior to Visit  Medication Sig Dispense Refill   albuterol (PROVENTIL) (2.5 MG/3ML) 0.083% nebulizer solution Take 3 mLs (2.5 mg total) by nebulization every 6 (six) hours as needed for wheezing or shortness of breath. 150 mL 1   Bioflavonoid Products (BIOFLEX PO) Take 1 tablet by mouth daily.     Multiple Vitamin (MULTIVITAMIN WITH MINERALS) TABS tablet Take 1 tablet by mouth daily.     mupirocin ointment (BACTROBAN) 2 % Apply topically 3 (three) times daily.     OMEGA-3 KRILL OIL PO Take by mouth.     zinc gluconate 50 MG tablet Take 50 mg by mouth daily.     No current facility-administered medications on file prior to visit.    There are no Patient Instructions on file for this visit. No follow-ups on file.   Georgiana Spinner, NP

## 2023-06-27 ENCOUNTER — Ambulatory Visit (INDEPENDENT_AMBULATORY_CARE_PROVIDER_SITE_OTHER): Payer: No Typology Code available for payment source | Admitting: Nurse Practitioner

## 2023-12-12 ENCOUNTER — Encounter: Payer: Self-pay | Admitting: Physician Assistant

## 2023-12-12 ENCOUNTER — Ambulatory Visit: Payer: Self-pay | Admitting: Physician Assistant

## 2023-12-12 ENCOUNTER — Other Ambulatory Visit: Payer: Self-pay

## 2023-12-12 VITALS — BP 171/96 | HR 95 | Temp 97.6°F | Ht 64.0 in | Wt 245.0 lb

## 2023-12-12 DIAGNOSIS — R030 Elevated blood-pressure reading, without diagnosis of hypertension: Secondary | ICD-10-CM

## 2023-12-12 DIAGNOSIS — J069 Acute upper respiratory infection, unspecified: Secondary | ICD-10-CM

## 2023-12-12 NOTE — Progress Notes (Signed)
 [facility] [address] [place], [place] [address]   Office Visit Note  Patient Name: Dwayne Dalton Date of Birth [birth year]  Medical Record number [id number]  Date of Service: 12/12/2023  Chief Complaint  Patient presents with   Cough    Patient c/o cough with white mucus production. Denies SOB, wheezing, and chest tightness. Symptoms began a week ago. He has been taking Mucinex and OTC cold and flu medication.     HPI Pt is here for a sick visit. Initially thought it was allergies, but his manager advised him to come in for a visit. Symptoms started last Friday, one week ago. He developed cough and congestion, some rhinorrhea. Hasn't gotten worse, but hasn't really gotten better. Hasn't taken a COVID or flu test.  Has taken mucinex which helps a little bit and some nasal spray. No aggravating factors.  No known sick contacts.  No F/C, ear symptoms, CP, SOB, wheezing, abd pain/n/v/d, other symptoms.    ROS: Review of Systems  Constitutional:  Negative for chills and fever.  HENT:  Positive for congestion and rhinorrhea. Negative for ear discharge and ear pain.   Respiratory:  Positive for cough. Negative for shortness of breath and wheezing.   Cardiovascular:  Negative for chest pain.  Gastrointestinal:  Negative for abdominal pain, constipation, diarrhea, nausea and vomiting.     Current Medication:  Outpatient Encounter Medications as of 12/12/2023  Medication Sig   Bioflavonoid Products (BIOFLEX PO) Take 1 tablet by mouth daily.   [employer] CALCIUM PO Take by mouth daily.   Multiple Vitamin (MULTIVITAMIN WITH MINERALS) TABS tablet Take 1 tablet by mouth daily.   OMEGA-3 KRILL OIL PO Take by mouth daily.   zinc gluconate 50 MG tablet Take 50 mg by mouth daily.   [DISCONTINUED] albuterol (PROVENTIL) (2.5 MG/3ML) 0.083% nebulizer solution Take 3 mLs (2.5 mg total) by nebulization every 6 (six) hours as needed for wheezing or shortness of breath.   [DISCONTINUED]  mupirocin ointment (BACTROBAN) 2 % Apply topically 3 (three) times daily.   No facility-administered encounter medications on file as of 12/12/2023.      Medical History: Past Medical History:  Diagnosis Date   Motor developmental delay    Obesity, Class III, BMI 40-49.9 (morbid obesity) (HCC) 05/31/2020     Vital Signs: BP (!) 171/96   Pulse 95   Temp 97.6 F (36.4 C)   Ht 5\' 4"  (1.626 m)   Wt 111.1 kg   SpO2 93%   BMI 42.05 kg/m    Physical Exam Vitals and nursing note reviewed.  Constitutional:      General: He is not in acute distress.    Appearance: Normal appearance. He is well-developed. He is not toxic-appearing.     Comments: Afebrile, nontoxic, NAD, BP elevated similar to other visits  HENT:     Head: Normocephalic and atraumatic.     Nose: Congestion and rhinorrhea present.     Mouth/Throat:     Mouth: Mucous membranes are moist.     Pharynx: Oropharynx is clear. Uvula midline.  Eyes:     General:        Right eye: No discharge.        Left eye: No discharge.     Conjunctiva/sclera: Conjunctivae normal.  Cardiovascular:     Rate and Rhythm: Normal rate and regular rhythm.     Pulses: Normal pulses.     Heart sounds: Normal heart sounds, S1 normal and S2 normal. No  murmur heard.    No friction rub. No gallop.  Pulmonary:     Effort: Pulmonary effort is normal. No respiratory distress.     Breath sounds: Normal breath sounds. No decreased breath sounds, wheezing, rhonchi or rales.     Comments: CTAB in all lung fields, no w/r/r, no hypoxia or increased WOB, speaking in full sentences, SpO2 93% on RA  Abdominal:     General: Bowel sounds are normal. There is no distension.     Palpations: Abdomen is soft. Abdomen is not rigid.     Tenderness: There is no abdominal tenderness. There is no right CVA tenderness, left CVA tenderness, guarding or rebound.  Musculoskeletal:        General: Normal range of motion.     Cervical back: Normal range of motion and  neck supple.  Skin:    General: Skin is warm and dry.     Findings: No rash.  Neurological:     Mental Status: He is alert and oriented to person, place, and time.     Sensory: Sensation is intact. No sensory deficit.     Motor: Motor function is intact.  Psychiatric:        Mood and Affect: Mood and affect normal.        Behavior: Behavior normal.       Assessment/Plan:   ICD-10-CM   1. URI with cough and congestion  J06.9     2. Elevated BP without diagnosis of hypertension  R03.0        1)-Pt here with URI symptoms x1wk, exam reveals pt is afebrile with a clear lung exam, mild nasal congestion and drainage, throat benign.  -Doubt need for COVID/flu testing at this time. Doubt need for CXR. -Suspect likely viral URI/infection.  -Discussed OTCs for symptomatic relief and supportive care (i.e. Alternating Tylenol 1000mg  max and Ibuprofen 600-800mg  max, Chloraseptic spray/throat lozenges for sore throat, hot tea with honey/lemon for sore throat, Flonase 2 sprays each nostril twice daily and Netipot for congestion, antihistamines like Zyrtec/Claritin/Xyzal/etc, decongestants like Sudafed for congestion with caution, Mucinex D for cough/congestion or Mucinex DM for cough/expectoration, adequate hydration and rest, etc).  -F/up as needed or within next week or so if not improving, at that time more testing may be considered vs empiric abx decision may be made if indicated.  -Strict return/ED guidelines discussed.   2) Also advised that his BP is elevated today, and has been at multiple other visits over the years. Pt asymptomatic, no s/sx of end organ damage or hypertensive emergency. Doubt need for emergent w/up or management today. Advised LOW SALT diet, and f/up with PCP to evaluate the need for HTN meds. Strict ED/return precautions given.   General Counseling: vergil burby understanding of the findings of todays visit and agrees with plan of treatment. I have discussed any  further diagnostic evaluation that may be needed or ordered today. We also reviewed his medications today. he has been encouraged to call the office with any questions or concerns that should arise related to todays visit.   No orders of the defined types were placed in this encounter.  No results found for this or any previous visit (from the past 24 hours).   No orders of the defined types were placed in this encounter.   Time spent: [address] Development worker, international aid

## 2024-02-24 ENCOUNTER — Encounter (INDEPENDENT_AMBULATORY_CARE_PROVIDER_SITE_OTHER): Payer: Self-pay
